# Patient Record
Sex: Female | Born: 1970 | Race: Black or African American | Hispanic: No | Marital: Married | Smoking: Former smoker
Health system: Southern US, Community
[De-identification: ages and names within clinical notes are randomized; demographics above are authoritative.]

## PROBLEM LIST (undated history)

## (undated) DIAGNOSIS — E7800 Pure hypercholesterolemia, unspecified: Secondary | ICD-10-CM

## (undated) DIAGNOSIS — D649 Anemia, unspecified: Secondary | ICD-10-CM

## (undated) DIAGNOSIS — I10 Essential (primary) hypertension: Secondary | ICD-10-CM

## (undated) HISTORY — PX: TUBAL LIGATION: SHX77

---

## 1998-04-27 HISTORY — PX: TONSILLECTOMY: SUR1361

## 2005-07-02 ENCOUNTER — Emergency Department (HOSPITAL_COMMUNITY): Admission: EM | Admit: 2005-07-02 | Discharge: 2005-07-02 | Payer: Self-pay | Admitting: Emergency Medicine

## 2007-09-08 ENCOUNTER — Emergency Department (HOSPITAL_COMMUNITY): Admission: EM | Admit: 2007-09-08 | Discharge: 2007-09-08 | Payer: Self-pay | Admitting: Emergency Medicine

## 2010-06-20 ENCOUNTER — Other Ambulatory Visit: Payer: Self-pay | Admitting: Family Medicine

## 2010-06-20 ENCOUNTER — Other Ambulatory Visit (HOSPITAL_COMMUNITY)
Admission: RE | Admit: 2010-06-20 | Discharge: 2010-06-20 | Disposition: A | Discharge status: Home / Self Care | Payer: 59 | Source: Ambulatory Visit | Attending: Internal Medicine | Admitting: Internal Medicine

## 2010-06-20 DIAGNOSIS — Z01419 Encounter for gynecological examination (general) (routine) without abnormal findings: Secondary | ICD-10-CM | POA: Insufficient documentation

## 2010-06-20 DIAGNOSIS — Z1231 Encounter for screening mammogram for malignant neoplasm of breast: Secondary | ICD-10-CM

## 2010-07-04 ENCOUNTER — Ambulatory Visit
Admission: RE | Admit: 2010-07-04 | Discharge: 2010-07-04 | Disposition: A | Discharge status: Home / Self Care | Payer: 59 | Source: Ambulatory Visit | Attending: Family Medicine | Admitting: Family Medicine

## 2010-07-04 DIAGNOSIS — Z1231 Encounter for screening mammogram for malignant neoplasm of breast: Secondary | ICD-10-CM

## 2010-07-09 ENCOUNTER — Other Ambulatory Visit: Payer: Self-pay | Admitting: Family Medicine

## 2010-07-09 DIAGNOSIS — R928 Other abnormal and inconclusive findings on diagnostic imaging of breast: Secondary | ICD-10-CM

## 2010-08-26 ENCOUNTER — Emergency Department (HOSPITAL_COMMUNITY)
Admission: EM | Admit: 2010-08-26 | Discharge: 2010-08-27 | Disposition: A | Discharge status: Home / Self Care | Payer: 59 | Attending: Emergency Medicine | Admitting: Emergency Medicine

## 2010-08-26 DIAGNOSIS — E119 Type 2 diabetes mellitus without complications: Secondary | ICD-10-CM | POA: Insufficient documentation

## 2010-08-26 DIAGNOSIS — R21 Rash and other nonspecific skin eruption: Secondary | ICD-10-CM | POA: Insufficient documentation

## 2010-08-26 DIAGNOSIS — Z79899 Other long term (current) drug therapy: Secondary | ICD-10-CM | POA: Insufficient documentation

## 2010-08-26 DIAGNOSIS — M79609 Pain in unspecified limb: Secondary | ICD-10-CM | POA: Insufficient documentation

## 2010-08-26 DIAGNOSIS — I10 Essential (primary) hypertension: Secondary | ICD-10-CM | POA: Insufficient documentation

## 2010-08-26 DIAGNOSIS — L02419 Cutaneous abscess of limb, unspecified: Secondary | ICD-10-CM | POA: Insufficient documentation

## 2010-08-26 LAB — DIFFERENTIAL
Eosinophils Relative: 4 % (ref 0–5)
Lymphs Abs: 4.1 10*3/uL — ABNORMAL HIGH (ref 0.7–4.0)
Monocytes Absolute: 0.7 10*3/uL (ref 0.1–1.0)
Neutro Abs: 4.6 10*3/uL (ref 1.7–7.7)

## 2010-08-26 LAB — GLUCOSE, CAPILLARY: Glucose-Capillary: 113 mg/dL — ABNORMAL HIGH (ref 70–99)

## 2010-08-26 LAB — COMPREHENSIVE METABOLIC PANEL
AST: 14 U/L (ref 0–37)
Albumin: 3.9 g/dL (ref 3.5–5.2)
BUN: 15 mg/dL (ref 6–23)
Calcium: 9.9 mg/dL (ref 8.4–10.5)
Creatinine, Ser: 0.95 mg/dL (ref 0.4–1.2)
GFR calc Af Amer: >60 mL/min (ref >60)
Total Protein: 8 g/dL (ref 6.0–8.3)

## 2010-08-26 LAB — CBC
HCT: 36.4 % (ref 36.0–46.0)
Hemoglobin: 12.4 g/dL (ref 12.0–15.0)
MCH: 19.3 pg — ABNORMAL LOW (ref 26.0–34.0)
MCHC: 34.1 g/dL (ref 30.0–36.0)
MCV: 56.7 fL — ABNORMAL LOW (ref 78.0–100.0)
RBC: 6.42 MIL/uL — ABNORMAL HIGH (ref 3.87–5.11)

## 2012-01-21 ENCOUNTER — Encounter (HOSPITAL_COMMUNITY): Payer: Self-pay | Admitting: Emergency Medicine

## 2012-01-21 ENCOUNTER — Inpatient Hospital Stay (HOSPITAL_COMMUNITY)
Admission: EM | Admit: 2012-01-21 | Discharge: 2012-01-23 | DRG: 603 | Disposition: A | Discharge status: Home / Self Care | Payer: 59 | Attending: Internal Medicine | Admitting: Internal Medicine

## 2012-01-21 DIAGNOSIS — D649 Anemia, unspecified: Secondary | ICD-10-CM | POA: Diagnosis present

## 2012-01-21 DIAGNOSIS — D509 Iron deficiency anemia, unspecified: Secondary | ICD-10-CM | POA: Diagnosis present

## 2012-01-21 DIAGNOSIS — R000 Tachycardia, unspecified: Secondary | ICD-10-CM | POA: Diagnosis present

## 2012-01-21 DIAGNOSIS — L03119 Cellulitis of unspecified part of limb: Principal | ICD-10-CM | POA: Diagnosis present

## 2012-01-21 DIAGNOSIS — R599 Enlarged lymph nodes, unspecified: Secondary | ICD-10-CM | POA: Diagnosis present

## 2012-01-21 DIAGNOSIS — L0390 Cellulitis, unspecified: Secondary | ICD-10-CM

## 2012-01-21 DIAGNOSIS — E119 Type 2 diabetes mellitus without complications: Secondary | ICD-10-CM | POA: Diagnosis present

## 2012-01-21 DIAGNOSIS — D72829 Elevated white blood cell count, unspecified: Secondary | ICD-10-CM | POA: Diagnosis present

## 2012-01-21 DIAGNOSIS — L02419 Cutaneous abscess of limb, unspecified: Principal | ICD-10-CM | POA: Diagnosis present

## 2012-01-21 DIAGNOSIS — Z87891 Personal history of nicotine dependence: Secondary | ICD-10-CM

## 2012-01-21 DIAGNOSIS — I10 Essential (primary) hypertension: Secondary | ICD-10-CM | POA: Diagnosis present

## 2012-01-21 HISTORY — DX: Essential (primary) hypertension: I10

## 2012-01-21 LAB — CBC WITH DIFFERENTIAL/PLATELET
Basophils Relative: 0 % (ref 0–1)
Eosinophils Absolute: 0.6 10*3/uL (ref 0.0–0.7)
Eosinophils Relative: 5 % (ref 0–5)
HCT: 29.8 % — ABNORMAL LOW (ref 36.0–46.0)
Hemoglobin: 9.8 g/dL — ABNORMAL LOW (ref 12.0–15.0)
Lymphs Abs: 3.6 10*3/uL (ref 0.7–4.0)
MCH: 18 pg — ABNORMAL LOW (ref 26.0–34.0)
MCHC: 32.9 g/dL (ref 30.0–36.0)
MCV: 54.9 fL — ABNORMAL LOW (ref 78.0–100.0)
Monocytes Absolute: 0.7 10*3/uL (ref 0.1–1.0)
Neutro Abs: 6.6 10*3/uL (ref 1.7–7.7)
Neutrophils Relative %: 58 % (ref 43–77)
RBC: 5.43 MIL/uL — ABNORMAL HIGH (ref 3.87–5.11)

## 2012-01-21 LAB — BASIC METABOLIC PANEL
BUN: 13 mg/dL (ref 6–23)
CO2: 26 mEq/L (ref 19–32)
Calcium: 9.5 mg/dL (ref 8.4–10.5)
Glucose, Bld: 164 mg/dL — ABNORMAL HIGH (ref 70–99)
Sodium: 136 mEq/L (ref 135–145)

## 2012-01-21 LAB — GLUCOSE, CAPILLARY

## 2012-01-21 MED ORDER — LABETALOL HCL 5 MG/ML IV SOLN
10.0000 mg | Freq: Once | INTRAVENOUS | Status: AC
  Administered 2012-01-21: 10 mg via INTRAVENOUS
  Filled 2012-01-21: qty 4

## 2012-01-21 MED ORDER — SODIUM CHLORIDE 0.9 % IV SOLN
Freq: Once | INTRAVENOUS | Status: AC
  Administered 2012-01-21: via INTRAVENOUS

## 2012-01-21 MED ORDER — VANCOMYCIN HCL IN DEXTROSE 1-5 GM/200ML-% IV SOLN
1000.0000 mg | Freq: Once | INTRAVENOUS | Status: AC
  Administered 2012-01-21: 1000 mg via INTRAVENOUS
  Filled 2012-01-21: qty 200

## 2012-01-21 NOTE — ED Notes (Signed)
Reports yesterday had bump on R lateral side of leg; pt reports it popped and clear liquid came out, now today having pain, swelling and redness, and warm to touch; pt noted to have High bp at triage; reports has not taking BP meds in 2 months

## 2012-01-21 NOTE — ED Provider Notes (Signed)
History     CSN: [id number]  Arrival date & time 01/21/12  1804   First MD Initiated Contact with Patient 01/21/12 2211      Chief Complaint  Patient presents with  . Cellulitis    (Consider location/radiation/quality/duration/timing/severity/associated sxs/prior treatment) The history is provided by the patient, a significant other and medical records.    Tracey Cordova is a 41 y.o. female presents to the emergency department complaining of cellulitis.  The onset of the symptoms was  gradual starting 1 days ago.  The patient has associated erythema, swelling, fever, chills.  The symptoms have been  persistent, gradually worsened.  nothing makes the symptoms worse and nothing makes symptoms better.  The patient denies headache, chest pain, shortness of breath, syncope, weakness.  Pt states she has a Hx of cellulitis last year.  She developed this cellulitis beginning yesterday.  She denies trauma to the site.  She states she thought it was an infected hair follicle and "popped it" but then it began to get worse. It is now streaking up her leg and she has pain in her groin.    Past Medical History  Diagnosis Date  . Hypertension   . Diabetes mellitus     Past Surgical History  Procedure Date  . Tubal ligation   . Tonsillectomy 2000    History reviewed. No pertinent family history.  History  Substance Use Topics  . Smoking status: Former Smoker    Quit date: 08/21/2011  . Smokeless tobacco: Not on file  . Alcohol Use: No    OB History    Grav Para Term Preterm Abortions TAB SAB Ect Mult Living                  Review of Systems  Constitutional: Negative for fever, diaphoresis, appetite change, fatigue and unexpected weight change.  HENT: Negative for mouth sores and neck stiffness.   Eyes: Negative for visual disturbance.  Respiratory: Negative for cough, chest tightness, shortness of breath and wheezing.   Cardiovascular: Negative for chest pain.  Gastrointestinal:  Negative for nausea, vomiting, abdominal pain, diarrhea and constipation.  Genitourinary: Negative for dysuria, urgency, frequency and hematuria.  Skin: Positive for wound. Negative for rash.       Abscess  Neurological: Negative for syncope, light-headedness and headaches.  Hematological: Positive for adenopathy.  Psychiatric/Behavioral: Negative for disturbed wake/sleep cycle. The patient is not nervous/anxious.   All other systems reviewed and are negative.    Allergies  Review of patient's allergies indicates no known allergies.  Home Medications   Current Outpatient Rx  Name Route Sig Dispense Refill  . METFORMIN HCL 500 MG PO TABS Oral Take 500 mg by mouth 2 (two) times daily with a meal.      BP 172/80  Pulse 100  Temp 99.5 F (37.5 C) (Oral)  Resp 16  SpO2 100%  Physical Exam  Nursing note and vitals reviewed. Constitutional: She appears well-developed and well-nourished. No distress.  HENT:  Head: Normocephalic and atraumatic.  Mouth/Throat: Oropharynx is clear and moist. No oropharyngeal exudate.  Eyes: Conjunctivae normal are normal. No scleral icterus.  Neck: Normal range of motion. Neck supple.  Cardiovascular: Regular rhythm, normal heart sounds, intact distal pulses and normal pulses.  Tachycardia present.  PMI is not displaced.   Pulmonary/Chest: Effort normal and breath sounds normal. No respiratory distress. She has no wheezes.  Abdominal: Soft. Bowel sounds are normal. She exhibits no mass. There is no tenderness. There is no  rebound and no guarding.  Musculoskeletal: Normal range of motion. She exhibits no edema.  Lymphadenopathy:       Head (right side): No submental, no submandibular, no tonsillar, no preauricular, no posterior auricular and no occipital adenopathy present.       Head (left side): No submental, no submandibular, no tonsillar, no preauricular, no posterior auricular and no occipital adenopathy present.    She has no cervical adenopathy.        Right cervical: No superficial cervical, no deep cervical and no posterior cervical adenopathy present.      Left cervical: No superficial cervical, no deep cervical and no posterior cervical adenopathy present.       Right: No inguinal and no supraclavicular adenopathy present.       Left: Inguinal adenopathy present. No supraclavicular adenopathy present.  Neurological: She is alert.       Speech is clear and goal oriented Moves extremities without ataxia  Skin: Skin is warm and dry. She is not diaphoretic.          Erythema extends from mid calf to above the knee on the RLE Streaking noted to extend into the R groin.    Psychiatric: She has a normal mood and affect.    ED Course  Procedures (including critical care time)  Labs Reviewed  CBC WITH DIFFERENTIAL - Abnormal; Notable for the following:    WBC 11.5 (*)     RBC 5.43 (*)     Hemoglobin 9.8 (*)     HCT 29.8 (*)     MCV 54.9 (*)     MCH 18.0 (*)     RDW 19.0 (*)     Platelets 444 (*)     All other components within normal limits  BASIC METABOLIC PANEL - Abnormal; Notable for the following:    Glucose, Bld 164 (*)     GFR calc non Af Amer 84 (*)     All other components within normal limits  GLUCOSE, CAPILLARY - Abnormal; Notable for the following:    Glucose-Capillary 166 (*)     All other components within normal limits  URINALYSIS, ROUTINE W REFLEX MICROSCOPIC   No results found. Results for orders placed during the hospital encounter of 01/21/12  CBC WITH DIFFERENTIAL      Component Value Range   WBC 11.5 (*) 4.0 - 10.5 K/uL   RBC 5.43 (*) 3.87 - 5.11 MIL/uL   Hemoglobin 9.8 (*) 12.0 - 15.0 g/dL   HCT 16.1 (*) 09.6 - 04.5 %   MCV 54.9 (*) 78.0 - 100.0 fL   MCH 18.0 (*) 26.0 - 34.0 pg   MCHC 32.9  30.0 - 36.0 g/dL   RDW 40.9 (*) 81.1 - 91.4 %   Platelets 444 (*) 150 - 400 K/uL   Neutrophils Relative 58  43 - 77 %   Lymphocytes Relative 31  12 - 46 %   Monocytes Relative 6  3 - 12 %   Eosinophils  Relative 5  0 - 5 %   Basophils Relative 0  0 - 1 %   Neutro Abs 6.6  1.7 - 7.7 K/uL   Lymphs Abs 3.6  0.7 - 4.0 K/uL   Monocytes Absolute 0.7  0.1 - 1.0 K/uL   Eosinophils Absolute 0.6  0.0 - 0.7 K/uL   Basophils Absolute 0.0  0.0 - 0.1 K/uL   RBC Morphology POLYCHROMASIA PRESENT    BASIC METABOLIC PANEL      Component Value  Range   Sodium 136  135 - 145 mEq/L   Potassium 4.2  3.5 - 5.1 mEq/L   Chloride 100  96 - 112 mEq/L   CO2 26  19 - 32 mEq/L   Glucose, Bld 164 (*) 70 - 99 mg/dL   BUN 13  6 - 23 mg/dL   Creatinine, Ser 1.61  0.50 - 1.10 mg/dL   Calcium 9.5  8.4 - 09.6 mg/dL   GFR calc non Af Amer 84 (*) >90 mL/min   GFR calc Af Amer >90  >90 mL/min  GLUCOSE, CAPILLARY      Component Value Range   Glucose-Capillary 166 (*) 70 - 99 mg/dL   Comment 1 Documented in Chart     Comment 2 Notify RN     No results found.    1. Cellulitis   2. Hypertension       MDM  Tracey Cordova presents with cellulitis of the the RLE with streaking into the groin.  Pt tachycardic and hypertensive.  Pt also has diabetes.  Leukocytosis of 11.5 without L shift.  BMP unremarkable.  UA and pregnancy test pending.  Patient hypertension and risk factors will admit to inpatient.  No evidence of  SIRS/sepsis.  His heart oriented, in no apparent distress. Labetalol 10 mg given IV. Patient blood pressure decreased to 172/80. Vancomycin 1 g IV begun. Patient admitted to telemetry bed under Dr Toniann Fail.         Dahlia Client Vihan Santagata, PA-C 01/22/12 0020

## 2012-01-21 NOTE — ED Notes (Signed)
The pt had a pimple on her rt lower lateral ,ower extremity yesterday.  She attempted to drain it with a needle.  Today she has had redness  Swelling and more pain to that area. Weeping from the blistered appearing area.  Redness and swelling to the entire rt leg from the rt groin down to her toes.  Similar  Lesion last year.

## 2012-01-22 ENCOUNTER — Encounter (HOSPITAL_COMMUNITY): Payer: Self-pay | Admitting: Internal Medicine

## 2012-01-22 DIAGNOSIS — D649 Anemia, unspecified: Secondary | ICD-10-CM | POA: Diagnosis present

## 2012-01-22 DIAGNOSIS — E119 Type 2 diabetes mellitus without complications: Secondary | ICD-10-CM

## 2012-01-22 DIAGNOSIS — I10 Essential (primary) hypertension: Secondary | ICD-10-CM

## 2012-01-22 DIAGNOSIS — L0390 Cellulitis, unspecified: Secondary | ICD-10-CM | POA: Diagnosis present

## 2012-01-22 DIAGNOSIS — L0291 Cutaneous abscess, unspecified: Secondary | ICD-10-CM

## 2012-01-22 LAB — URINALYSIS, ROUTINE W REFLEX MICROSCOPIC
Hgb urine dipstick: NEGATIVE
Nitrite: NEGATIVE
Specific Gravity, Urine: 1.035 — ABNORMAL HIGH (ref 1.005–1.030)
Urobilinogen, UA: 0.2 mg/dL (ref 0.0–1.0)
pH: 5 (ref 5.0–8.0)

## 2012-01-22 LAB — COMPREHENSIVE METABOLIC PANEL
AST: 18 U/L (ref 0–37)
Albumin: 3.4 g/dL — ABNORMAL LOW (ref 3.5–5.2)
Alkaline Phosphatase: 72 U/L (ref 39–117)
Chloride: 98 mEq/L (ref 96–112)
Creatinine, Ser: 0.84 mg/dL (ref 0.50–1.10)
Potassium: 3.8 mEq/L (ref 3.5–5.1)
Sodium: 135 mEq/L (ref 135–145)
Total Bilirubin: 0.2 mg/dL — ABNORMAL LOW (ref 0.3–1.2)

## 2012-01-22 LAB — URINE MICROSCOPIC-ADD ON

## 2012-01-22 LAB — CBC
MCH: 17.7 pg — ABNORMAL LOW (ref 26.0–34.0)
MCV: 55 fL — ABNORMAL LOW (ref 78.0–100.0)
Platelets: 403 10*3/uL — ABNORMAL HIGH (ref 150–400)
RBC: 5.2 MIL/uL — ABNORMAL HIGH (ref 3.87–5.11)

## 2012-01-22 LAB — GLUCOSE, CAPILLARY
Glucose-Capillary: 188 mg/dL — ABNORMAL HIGH (ref 70–99)
Glucose-Capillary: 127 mg/dL — ABNORMAL HIGH (ref 70–99)

## 2012-01-22 MED ORDER — LABETALOL HCL 5 MG/ML IV SOLN
10.0000 mg | INTRAVENOUS | Status: DC | PRN
  Administered 2012-01-22: 10 mg via INTRAVENOUS
  Filled 2012-01-22 (×3): qty 4

## 2012-01-22 MED ORDER — LISINOPRIL 20 MG PO TABS
20.0000 mg | ORAL_TABLET | Freq: Every day | ORAL | Status: DC
  Administered 2012-01-22 – 2012-01-23 (×2): 20 mg via ORAL
  Filled 2012-01-22 (×2): qty 1

## 2012-01-22 MED ORDER — HYDROCHLOROTHIAZIDE 25 MG PO TABS
12.5000 mg | ORAL_TABLET | Freq: Every day | ORAL | Status: DC
Start: 2012-01-22 — End: 2012-01-22
  Filled 2012-01-22: qty 0.5

## 2012-01-22 MED ORDER — SODIUM CHLORIDE 0.9 % IV SOLN
INTRAVENOUS | Status: DC
  Administered 2012-01-22 – 2012-01-23 (×2): via INTRAVENOUS

## 2012-01-22 MED ORDER — SODIUM CHLORIDE 0.9 % IV SOLN: INTRAVENOUS | Status: DC

## 2012-01-22 MED ORDER — ACETAMINOPHEN 325 MG PO TABS: 650.0000 mg | ORAL_TABLET | Freq: Four times a day (QID) | ORAL | Status: DC | PRN

## 2012-01-22 MED ORDER — ACETAMINOPHEN 650 MG RE SUPP: 650.0000 mg | Freq: Four times a day (QID) | RECTAL | Status: DC | PRN

## 2012-01-22 MED ORDER — ENOXAPARIN SODIUM 40 MG/0.4ML ~~LOC~~ SOLN
40.0000 mg | SUBCUTANEOUS | Status: DC
  Administered 2012-01-22: 40 mg via SUBCUTANEOUS
  Filled 2012-01-22 (×3): qty 0.4

## 2012-01-22 MED ORDER — ONDANSETRON HCL 4 MG PO TABS: 4.0000 mg | ORAL_TABLET | Freq: Four times a day (QID) | ORAL | Status: DC | PRN

## 2012-01-22 MED ORDER — DIPHENHYDRAMINE HCL 25 MG PO CAPS
25.0000 mg | ORAL_CAPSULE | Freq: Four times a day (QID) | ORAL | Status: DC | PRN
  Administered 2012-01-22: 25 mg via ORAL
  Filled 2012-01-22: qty 1

## 2012-01-22 MED ORDER — CEFEPIME HCL 1 G IJ SOLR
1.0000 g | Freq: Two times a day (BID) | INTRAMUSCULAR | Status: DC
  Administered 2012-01-22 – 2012-01-23 (×4): 1 g via INTRAVENOUS
  Filled 2012-01-22 (×6): qty 1

## 2012-01-22 MED ORDER — HYDROCHLOROTHIAZIDE 12.5 MG PO CAPS
12.5000 mg | ORAL_CAPSULE | Freq: Every day | ORAL | Status: DC
  Administered 2012-01-22 – 2012-01-23 (×2): 12.5 mg via ORAL
  Filled 2012-01-22 (×2): qty 1

## 2012-01-22 MED ORDER — VANCOMYCIN HCL IN DEXTROSE 1-5 GM/200ML-% IV SOLN
1000.0000 mg | Freq: Two times a day (BID) | INTRAVENOUS | Status: DC
  Administered 2012-01-22 – 2012-01-23 (×3): 1000 mg via INTRAVENOUS
  Filled 2012-01-22 (×5): qty 200

## 2012-01-22 MED ORDER — SODIUM CHLORIDE 0.9 % IJ SOLN
3.0000 mL | Freq: Two times a day (BID) | INTRAMUSCULAR | Status: DC
  Administered 2012-01-22: 3 mL via INTRAVENOUS

## 2012-01-22 MED ORDER — ONDANSETRON HCL 4 MG/2ML IJ SOLN: 4.0000 mg | Freq: Four times a day (QID) | INTRAMUSCULAR | Status: DC | PRN

## 2012-01-22 MED ORDER — TETANUS-DIPHTH-ACELL PERTUSSIS 5-2.5-18.5 LF-MCG/0.5 IM SUSP
0.5000 mL | Freq: Once | INTRAMUSCULAR | Status: AC
  Administered 2012-01-22: 0.5 mL via INTRAMUSCULAR
  Filled 2012-01-22 (×2): qty 0.5

## 2012-01-22 MED ORDER — INSULIN ASPART 100 UNIT/ML ~~LOC~~ SOLN
0.0000 [IU] | Freq: Three times a day (TID) | SUBCUTANEOUS | Status: DC
  Administered 2012-01-22: 2 [IU] via SUBCUTANEOUS
  Administered 2012-01-22: 1 [IU] via SUBCUTANEOUS
  Administered 2012-01-22 – 2012-01-23 (×3): 2 [IU] via SUBCUTANEOUS

## 2012-01-22 MED ORDER — DIPHENHYDRAMINE HCL 25 MG PO CAPS
25.0000 mg | ORAL_CAPSULE | Freq: Once | ORAL | Status: AC
  Administered 2012-01-22: 25 mg via ORAL
  Filled 2012-01-22: qty 1

## 2012-01-22 NOTE — Progress Notes (Signed)
Pt seen and examined Admitted w/ 1. RLE cellulitis: continue current ABx, change to PO tomorrow 2. Uncontrolled HTN: start Lisinopril, HCTZ 3. DM: Continue metformin, SSI, check AIC 4. H/o Iron Defi anemia: FU with PCP, anemia panel  Zannie Cove, MD [phone]

## 2012-01-22 NOTE — H&P (Signed)
Tracey Cordova is an 41 y.o. female.   Patient was seen and examined on January 22, 2012. PCP - Dr. Haskel Schroeder. Chief Complaint: Right lower extremity pain and swelling. HPI: 41 year-old female history of diabetes mellitus type 2, hypertension and iron deficiency anemia presents with complaints of pain and swelling on the right leg on the lateral aspect. She has been having these symptoms for last 2 days. Started a small pimple-like lesion which he tried to scratch and has mild discharge coming out of it. Patient also has some groin pain. Denies any fever chills. Had similar symptoms last year on the left thigh. In the ER patient was found to have leukocytosis and was found to be mildly febrile. Patient also mildly tachycardic but blood pressure and control and had to be given labetalol IV to control. Patient has been admitted for further management.  Past Medical History  Diagnosis Date  . Hypertension   . Diabetes mellitus     Past Surgical History  Procedure Date  . Tubal ligation   . Tonsillectomy 2000    Family History  Problem Relation Age of Onset  . Other Mother    Social History:  reports that she quit smoking about 5 months ago. She does not have any smokeless tobacco history on file. She reports that she does not drink alcohol or use illicit drugs.  Allergies: No Known Allergies   (Not in a hospital admission)  Results for orders placed during the hospital encounter of 01/21/12 (from the past 48 hour(s))  CBC WITH DIFFERENTIAL     Status: Abnormal   Collection Time   01/21/12  6:15 PM      Component Value Range Comment   WBC 11.5 (*) 4.0 - 10.5 K/uL    RBC 5.43 (*) 3.87 - 5.11 MIL/uL    Hemoglobin 9.8 (*) 12.0 - 15.0 g/dL    HCT 16.1 (*) 09.6 - 46.0 %    MCV 54.9 (*) 78.0 - 100.0 fL    MCH 18.0 (*) 26.0 - 34.0 pg    MCHC 32.9  30.0 - 36.0 g/dL    RDW 04.5 (*) 40.9 - 15.5 %    Platelets 444 (*) 150 - 400 K/uL    Neutrophils Relative 58  43 - 77 %    Lymphocytes  Relative 31  12 - 46 %    Monocytes Relative 6  3 - 12 %    Eosinophils Relative 5  0 - 5 %    Basophils Relative 0  0 - 1 %    Neutro Abs 6.6  1.7 - 7.7 K/uL    Lymphs Abs 3.6  0.7 - 4.0 K/uL    Monocytes Absolute 0.7  0.1 - 1.0 K/uL    Eosinophils Absolute 0.6  0.0 - 0.7 K/uL    Basophils Absolute 0.0  0.0 - 0.1 K/uL    RBC Morphology POLYCHROMASIA PRESENT   TARGET CELLS  BASIC METABOLIC PANEL     Status: Abnormal   Collection Time   01/21/12  6:15 PM      Component Value Range Comment   Sodium 136  135 - 145 mEq/L    Potassium 4.2  3.5 - 5.1 mEq/L    Chloride 100  96 - 112 mEq/L    CO2 26  19 - 32 mEq/L    Glucose, Bld 164 (*) 70 - 99 mg/dL    BUN 13  6 - 23 mg/dL    Creatinine, Ser 8.11  0.50 - 1.10 mg/dL    Calcium 9.5  8.4 - 16.1 mg/dL    GFR calc non Af Amer 84 (*) >90 mL/min    GFR calc Af Amer >90  >90 mL/min   GLUCOSE, CAPILLARY     Status: Abnormal   Collection Time   01/21/12  6:18 PM      Component Value Range Comment   Glucose-Capillary 166 (*) 70 - 99 mg/dL    Comment 1 Documented in Chart      Comment 2 Notify RN      No results found.  Review of Systems  Constitutional: Negative.   HENT: Negative.   Eyes: Negative.   Respiratory: Negative.   Cardiovascular: Negative.   Gastrointestinal: Negative.   Genitourinary: Negative.   Musculoskeletal:       Pain and swelling of the right leg.  Skin: Negative.   Neurological: Negative.   Endo/Heme/Allergies: Negative.   Psychiatric/Behavioral: Negative.     Blood pressure 172/80, pulse 100, temperature 99.5 F (37.5 C), temperature source Oral, resp. rate 16, SpO2 100.00%. Physical Exam  Constitutional: She is oriented to person, place, and time. She appears well-developed and well-nourished. No distress.  HENT:  Head: Normocephalic and atraumatic.  Right Ear: External ear normal.  Left Ear: External ear normal.  Nose: Nose normal.  Mouth/Throat: Oropharynx is clear and moist. No oropharyngeal exudate.    Eyes: Conjunctivae normal are normal. Pupils are equal, round, and reactive to light. Right eye exhibits no discharge. Left eye exhibits no discharge. No scleral icterus.  Neck: Normal range of motion. Neck supple.  Cardiovascular: Normal rate and regular rhythm.   Respiratory: Effort normal and breath sounds normal. No respiratory distress. She has no wheezes. She has no rales.  GI: Soft. Bowel sounds are normal. She exhibits no distension. There is no tenderness. There is no rebound and no guarding.  Musculoskeletal:       Mildly indurated skin on the lateral aspect of right lef with small opening and discharge.  Neurological: She is alert and oriented to person, place, and time.       Moves all extremities.  Skin: She is not diaphoretic.     Assessment/Plan #1. Cellulitis of the right leg - at this time patient has been started vancomycin. At cefepime for now. Closely observe. Any worsening may need MRI to check for any abscess formation. #2. Uncontrolled hypertension - patient states she takes antihypertensives the name of which he does not recall. Her blood pressure was more than 200 initially and had to be given labetalol IV bolus. I'm going to continue with labetalol 10 mg IV bolus every 2 hours for systolic more than 160. Once patient's home medication's name is known we will continue to closely observe her blood pressure. #3. Diabetes mellitus2 - while in the hospital we will place patient on sliding-scale coverage. Check hemoglobin A1c. #4. Iron deficiency anemia - follow CBC.  Patient's pregnancy screen is pending.  CODE STATUS - full code.  Keala Drum N. 01/22/2012, 12:54 AM

## 2012-01-22 NOTE — ED Notes (Signed)
Report to 6700

## 2012-01-22 NOTE — ED Provider Notes (Signed)
Medical screening examination/treatment/procedure(s) were performed by non-physician practitioner and as supervising physician I was immediately available for consultation/collaboration.  Loren Racer, MD 01/22/12 [phone]

## 2012-01-22 NOTE — Progress Notes (Signed)
Noted request to assist with medications, however pt has Nurse, learning disability, will provide applications from [employer] if aware of medications prior to d/c. Pt with insurance often do not meet criteria will follow. Johny Shock RN MPH Case Manager [phone]

## 2012-01-22 NOTE — Progress Notes (Signed)
Utilization Review Completed.  

## 2012-01-22 NOTE — Progress Notes (Signed)
ANTIBIOTIC CONSULT NOTE - INITIAL  Pharmacy Consult for Vancomycin/Cefepime Indication: RLE cellulitis  No Known Allergies  Vital Signs: Temp: 99.5 F (37.5 C) (09/26 2235) Temp src: Oral (09/26 2235) BP: 172/80 mmHg (09/26 2358) Pulse Rate: 100  (09/26 2235) Intake/Output from previous day:   Intake/Output from this shift:    Labs:  Basename 01/21/12 1815  WBC 11.5*  HGB 9.8*  PLT 444*  LABCREA --  CREATININE 0.84   CrCl is unknown because there is no height on file for the current visit. No results found for this basename: VANCOTROUGH:2,VANCOPEAK:2,VANCORANDOM:2,GENTTROUGH:2,GENTPEAK:2,GENTRANDOM:2,TOBRATROUGH:2,TOBRAPEAK:2,TOBRARND:2,AMIKACINPEAK:2,AMIKACINTROU:2,AMIKACIN:2, in the last 72 hours   Microbiology: No results found for this or any previous visit (from the past 720 hour(s)).  Medical History: Past Medical History  Diagnosis Date  . Hypertension   . Diabetes mellitus     Medications:   (Not in a hospital admission) Assessment: 41 y/o female patient admitted with right lower extremity pain and swelling requiring broad spectrum antibiotics for cellulitis. Received 1g Vancomycin at 2345 in ED.  Goal of Therapy:  Vancomycin trough level 10-15 mcg/ml  Plan:  Vancomycin 1g IV q12h, cefepime 1g IV q12 and will monitor renal function. Measure antibiotic drug levels at steady state  Verlene Mayer, PharmD, BCPS Pager [phone] 01/22/2012,1:50 AM

## 2012-01-23 LAB — GLUCOSE, CAPILLARY
Glucose-Capillary: 155 mg/dL — ABNORMAL HIGH (ref 70–99)
Glucose-Capillary: 177 mg/dL — ABNORMAL HIGH (ref 70–99)

## 2012-01-23 MED ORDER — DOXYCYCLINE HYCLATE 100 MG PO TABS: 100.0000 mg | ORAL_TABLET | Freq: Two times a day (BID) | ORAL | Status: DC

## 2012-01-23 MED ORDER — HYDROCHLOROTHIAZIDE 12.5 MG PO CAPS: 12.5000 mg | ORAL_CAPSULE | Freq: Every day | ORAL | Status: DC

## 2012-01-23 MED ORDER — LISINOPRIL 20 MG PO TABS: 20.0000 mg | ORAL_TABLET | Freq: Every day | ORAL | Status: DC

## 2012-01-23 NOTE — Progress Notes (Signed)
Pt discharged to home accompanied by family. Discharge instructions given and explained. Pt stated understanding. Pt informed that rx were called into [facility] pharmacy on [address]. Pt left unit in stable condition.

## 2012-01-25 NOTE — Progress Notes (Signed)
   CARE MANAGEMENT NOTE 01/25/2012  Patient:  Tracey Cordova, Tracey Cordova   Account Number:  [id number]  Date Initiated:  01/25/2012  Documentation initiated by:  Catarina Huntley  Subjective/Objective Assessment:   Request for assistance with medication.     Action/Plan:   Pt has commercial insurance with medication benefits, CM unable to assist.   Anticipated DC Date:  01/23/2012   Anticipated DC Plan:  HOME/SELF CARE         Choice offered to / List presented to:             Status of service:  Completed, signed off Medicare Important Message given?   (If response is "NO", the following Medicare IM given date fields will be blank) Date Medicare IM given:   Date Additional Medicare IM given:    Discharge Disposition:  HOME/SELF CARE  Per UR Regulation:    If discussed at Long Length of Stay Meetings, dates discussed:    Comments:

## 2012-02-04 NOTE — Discharge Summary (Signed)
Physician Discharge Summary  Patient ID: Tracey Cordova MRN: [id number] DOB/AGE: [birth year] 40 y.o.  Admit date: 01/21/2012 Discharge date: 02/04/2012  Primary Care Physician:  No primary provider on file.   Discharge Diagnoses:    Principal Problem:  *Cellulitis Active Problems:  DM2 (diabetes mellitus, type 2)  HTN (hypertension)  Obesity  Uncontrolled HTN  Iron deficiency anemia     Medication List     As of 02/04/2012  2:57 PM    TAKE these medications         doxycycline 100 MG tablet   Commonly known as: VIBRA-TABS   Take 1 tablet (100 mg total) by mouth 2 (two) times daily. For 8 days      hydrochlorothiazide 12.5 MG capsule   Commonly known as: MICROZIDE   Take 1 capsule (12.5 mg total) by mouth daily.      lisinopril 20 MG tablet   Commonly known as: PRINIVIL,ZESTRIL   Take 1 tablet (20 mg total) by mouth daily.      metFORMIN 500 MG tablet   Commonly known as: GLUCOPHAGE   Take 500 mg by mouth 2 (two) times daily with a meal.         Disposition and Follow-up:  PCP in 1-2 weeks    Significant Diagnostic Studies:  No results found.  Brief H and P:  41 year-old female history of diabetes mellitus type 2, hypertension and iron deficiency anemia presents with complaints of pain and swelling on the right leg on the lateral aspect. She has been having these symptoms for last 2 days. Started a small pimple-like lesion which he tried to scratch and has mild discharge coming out of it. Patient also has some groin pain. Denies any fever chills. Had similar symptoms last year on the left thigh. In the ER patient was found to have leukocytosis and was found to be mildly febrile. Patient also mildly tachycardic but blood pressure and control and had to be given labetalol IV to control. Patient has been admitted for further management.    Hospital Course:  1. RLE cellulitis:  Improved with empiric Vanc/Zosyn Changed to Po Doxycycline at discharge   2.  Uncontrolled HTN: started Lisinopril, HCTZ  Was non complaint with medications prior to admission  3. DM: Continue metformin, SSI, AIC 7.7, lifestyle modification emphasized   4. H/o Iron Defi anemia: FU with PCP, anemia panel pending, suspect from menstrual blood loss    Time spent on Discharge:  Signed: Kennth Vanbenschoten [facility]  02/04/2012, 2:57 PM

## 2012-05-29 ENCOUNTER — Encounter (HOSPITAL_COMMUNITY): Payer: Self-pay | Admitting: Emergency Medicine

## 2012-05-29 ENCOUNTER — Emergency Department (HOSPITAL_COMMUNITY)
Admission: EM | Admit: 2012-05-29 | Discharge: 2012-05-29 | Disposition: A | Discharge status: Home / Self Care | Payer: 59 | Attending: Emergency Medicine | Admitting: Emergency Medicine

## 2012-05-29 DIAGNOSIS — Z79899 Other long term (current) drug therapy: Secondary | ICD-10-CM | POA: Insufficient documentation

## 2012-05-29 DIAGNOSIS — I10 Essential (primary) hypertension: Secondary | ICD-10-CM | POA: Insufficient documentation

## 2012-05-29 DIAGNOSIS — E119 Type 2 diabetes mellitus without complications: Secondary | ICD-10-CM | POA: Insufficient documentation

## 2012-05-29 DIAGNOSIS — IMO0002 Reserved for concepts with insufficient information to code with codable children: Secondary | ICD-10-CM | POA: Insufficient documentation

## 2012-05-29 DIAGNOSIS — M7989 Other specified soft tissue disorders: Secondary | ICD-10-CM | POA: Insufficient documentation

## 2012-05-29 DIAGNOSIS — Z87891 Personal history of nicotine dependence: Secondary | ICD-10-CM | POA: Insufficient documentation

## 2012-05-29 DIAGNOSIS — L0390 Cellulitis, unspecified: Secondary | ICD-10-CM

## 2012-05-29 MED ORDER — CEPHALEXIN 500 MG PO CAPS: ORAL_CAPSULE | ORAL | Status: DC

## 2012-05-29 MED ORDER — DIPHENHYDRAMINE HCL 25 MG PO TABS: 50.0000 mg | ORAL_TABLET | ORAL | Status: DC | PRN

## 2012-05-29 MED ORDER — OXYCODONE-ACETAMINOPHEN 5-325 MG PO TABS: 2.0000 | ORAL_TABLET | ORAL | Status: DC | PRN

## 2012-05-29 NOTE — ED Provider Notes (Signed)
History     CSN: [id number]  Arrival date & time 05/29/12  2027   First MD Initiated Contact with Patient 05/29/12 2108      Chief Complaint  Patient presents with  . Arm Pain  . Arm Swelling    (Consider location/radiation/quality/duration/timing/severity/associated sxs/prior treatment) HPI This 42 year old female has a history of cellulitis in the past, she woke up today and has a one-day history of some redness warmth and tenderness to her left forearm with some itching as well. There is no weakness or numbness to the arm. She is no fever vomiting lightheadedness shortness of breath abdominal pain or other concerns. There is no treatment prior to arrival. Her pain is moderately severe. Her pain is worse with palpation and better she keeps her arm still. There is no asymmetry no red streaks up her arm. There is no trauma. She feels just like when she had prior localized cellulitis. She does not have any polyuria or polydipsia nausea or vomiting. Past Medical History  Diagnosis Date  . Hypertension   . Diabetes mellitus     Past Surgical History  Procedure Laterality Date  . Tubal ligation    . Tonsillectomy  2000    Family History  Problem Relation Age of Onset  . Other Mother     History  Substance Use Topics  . Smoking status: Former Smoker    Quit date: 08/21/2011  . Smokeless tobacco: Not on file  . Alcohol Use: Yes     Comment: Occassional Use    OB History   Grav Para Term Preterm Abortions TAB SAB Ect Mult Living                  Review of Systems 10 Systems reviewed and are negative for acute change except as noted in the HPI. Allergies  Lisinopril  Home Medications   Current Outpatient Rx  Name  Route  Sig  Dispense  Refill  . cloNIDine (CATAPRES) 0.3 MG tablet   Oral   Take 0.3 mg by mouth 2 (two) times daily.         . ferrous sulfate 325 (65 FE) MG tablet   Oral   Take 325 mg by mouth daily with breakfast.         .  hydrochlorothiazide (MICROZIDE) 12.5 MG capsule   Oral   Take 1 capsule (12.5 mg total) by mouth daily.   30 capsule   0   . metFORMIN (GLUCOPHAGE) 500 MG tablet   Oral   Take 500 mg by mouth 2 (two) times daily with a meal.         . olmesartan (BENICAR) 40 MG tablet   Oral   Take 40 mg by mouth daily.         . cephALEXin (KEFLEX) 500 MG capsule      2 caps po bid x 7 days   28 capsule   0   . diphenhydrAMINE (BENADRYL) 25 MG tablet   Oral   Take 2 tablets (50 mg total) by mouth every 4 (four) hours as needed for itching.   20 tablet   0   . oxyCODONE-acetaminophen (PERCOCET) 5-325 MG per tablet   Oral   Take 2 tablets by mouth every 4 (four) hours as needed for pain.   20 tablet   0     BP 231/119  Temp(Src) 98.6 F (37 C) (Oral)  Resp 18  SpO2 95%  LMP 05/28/2012  Physical Exam  Nursing  note and vitals reviewed. Constitutional:       Awake, alert, nontoxic appearance.  HENT:  Head: Atraumatic.  Eyes: Right eye exhibits no discharge. Left eye exhibits no discharge.  Neck: Neck supple.  Cardiovascular: Normal rate and regular rhythm.   No murmur heard. Pulmonary/Chest: Effort normal and breath sounds normal. No respiratory distress. She has no wheezes. She has no rales. She exhibits no tenderness.  Abdominal: Soft. Bowel sounds are normal. She exhibits no distension and no mass. There is no tenderness. There is no rebound and no guarding.  Musculoskeletal: She exhibits tenderness.       Baseline ROM, no obvious new focal weakness. Right arm and both legs are nontender. Left arm has what appears to be localized cellulitis to the proximal medial forearm and medial distal upper arm with erythema edema induration warmth tenderness without obvious abscess or any fluctuance, it almost appears as if it is an urticarial plaque but no petechiae purpura fluctuance vesicles and there is no diffuse arm swelling or edema to the arm at all she also has cap refill less  than 2 seconds in her hand with normal light touch to her arm with good movement and her joints are nontender and there is no circumferential redness or tenderness or swelling. Clinically I doubt deep venous thrombosis necrotizing fasciitis abscess sepsis or other condition requiring further evaluation in the ED tonight. She appears stable for outpatient followup.  Neurological: She is alert.       Mental status and motor strength appears baseline for patient and situation.  Skin: No rash noted.  Psychiatric: She has a normal mood and affect.    ED Course  Procedures (including critical care time)  Labs Reviewed - No data to display No results found.   1. Cellulitis       MDM  Patient / Family / Caregiver informed of clinical course, understand medical decision-making process, and agree with plan.  I doubt any other EMC precluding discharge at this time including, but not necessarily limited to the following: DVT. Necrotizing fasciitis, sepsis.        Hurman Horn, MD 06/04/12 [phone]

## 2012-05-29 NOTE — ED Notes (Addendum)
Patient complaining of left arm swelling and redness that started today (mostly around elbow); patient reports that she has a history of cellulitis in the past and has been treated here twice before.  Patient reports that she has had IVs in the past to receive IV antibiotics for cellulitis.  Last cellulitis episode in September of last year.

## 2012-07-22 ENCOUNTER — Encounter (HOSPITAL_COMMUNITY): Payer: Self-pay | Admitting: Emergency Medicine

## 2012-07-22 ENCOUNTER — Emergency Department (HOSPITAL_COMMUNITY)
Admission: EM | Admit: 2012-07-22 | Discharge: 2012-07-23 | Disposition: A | Discharge status: Home / Self Care | Payer: 59 | Attending: Emergency Medicine | Admitting: Emergency Medicine

## 2012-07-22 DIAGNOSIS — E7800 Pure hypercholesterolemia, unspecified: Secondary | ICD-10-CM | POA: Insufficient documentation

## 2012-07-22 DIAGNOSIS — Z87891 Personal history of nicotine dependence: Secondary | ICD-10-CM | POA: Insufficient documentation

## 2012-07-22 DIAGNOSIS — Z8679 Personal history of other diseases of the circulatory system: Secondary | ICD-10-CM | POA: Insufficient documentation

## 2012-07-22 DIAGNOSIS — Y92410 Unspecified street and highway as the place of occurrence of the external cause: Secondary | ICD-10-CM | POA: Insufficient documentation

## 2012-07-22 DIAGNOSIS — T148XXA Other injury of unspecified body region, initial encounter: Secondary | ICD-10-CM | POA: Insufficient documentation

## 2012-07-22 DIAGNOSIS — S0993XA Unspecified injury of face, initial encounter: Secondary | ICD-10-CM | POA: Insufficient documentation

## 2012-07-22 DIAGNOSIS — D649 Anemia, unspecified: Secondary | ICD-10-CM | POA: Insufficient documentation

## 2012-07-22 DIAGNOSIS — Y9389 Activity, other specified: Secondary | ICD-10-CM | POA: Insufficient documentation

## 2012-07-22 DIAGNOSIS — IMO0002 Reserved for concepts with insufficient information to code with codable children: Secondary | ICD-10-CM | POA: Insufficient documentation

## 2012-07-22 DIAGNOSIS — I10 Essential (primary) hypertension: Secondary | ICD-10-CM | POA: Insufficient documentation

## 2012-07-22 DIAGNOSIS — S199XXA Unspecified injury of neck, initial encounter: Secondary | ICD-10-CM | POA: Insufficient documentation

## 2012-07-22 DIAGNOSIS — E119 Type 2 diabetes mellitus without complications: Secondary | ICD-10-CM | POA: Insufficient documentation

## 2012-07-22 DIAGNOSIS — S7010XA Contusion of unspecified thigh, initial encounter: Secondary | ICD-10-CM | POA: Insufficient documentation

## 2012-07-22 DIAGNOSIS — Z79899 Other long term (current) drug therapy: Secondary | ICD-10-CM | POA: Insufficient documentation

## 2012-07-22 HISTORY — DX: Pure hypercholesterolemia, unspecified: E78.00

## 2012-07-22 HISTORY — DX: Anemia, unspecified: D64.9
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
## 2012-07-23 MED ORDER — DIAZEPAM 5 MG PO TABS: 5.0000 mg | ORAL_TABLET | Freq: Two times a day (BID) | ORAL | Status: DC
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
## 2012-08-10 NOTE — ED Provider Notes (Signed)
Medical screening examination/treatment/procedure(s) were performed by non-physician practitioner and as supervising physician I was immediately available for consultation/collaboration.   Seema Blum, MD 08/10/12 0811 

## 2013-06-14 ENCOUNTER — Encounter (HOSPITAL_COMMUNITY): Payer: Self-pay | Admitting: Emergency Medicine

## 2013-06-14 ENCOUNTER — Emergency Department (HOSPITAL_COMMUNITY)
Admission: EM | Admit: 2013-06-14 | Discharge: 2013-06-15 | Disposition: A | Discharge status: Home / Self Care | Payer: 59 | Attending: Emergency Medicine | Admitting: Emergency Medicine

## 2013-06-14 DIAGNOSIS — I169 Hypertensive crisis, unspecified: Secondary | ICD-10-CM

## 2013-06-14 DIAGNOSIS — E7800 Pure hypercholesterolemia, unspecified: Secondary | ICD-10-CM | POA: Insufficient documentation

## 2013-06-14 DIAGNOSIS — E119 Type 2 diabetes mellitus without complications: Secondary | ICD-10-CM | POA: Insufficient documentation

## 2013-06-14 DIAGNOSIS — L0390 Cellulitis, unspecified: Secondary | ICD-10-CM

## 2013-06-14 DIAGNOSIS — D649 Anemia, unspecified: Secondary | ICD-10-CM | POA: Insufficient documentation

## 2013-06-14 DIAGNOSIS — Z79899 Other long term (current) drug therapy: Secondary | ICD-10-CM | POA: Insufficient documentation

## 2013-06-14 DIAGNOSIS — L02419 Cutaneous abscess of limb, unspecified: Secondary | ICD-10-CM | POA: Insufficient documentation

## 2013-06-14 DIAGNOSIS — L03119 Cellulitis of unspecified part of limb: Principal | ICD-10-CM

## 2013-06-14 DIAGNOSIS — I10 Essential (primary) hypertension: Secondary | ICD-10-CM | POA: Insufficient documentation

## 2013-06-14 DIAGNOSIS — Z87891 Personal history of nicotine dependence: Secondary | ICD-10-CM | POA: Insufficient documentation

## 2013-06-14 NOTE — ED Notes (Addendum)
Pt denies headaches or blurred vision; reports BP always this high even though she takes BP meds.

## 2013-06-14 NOTE — ED Notes (Signed)
Pt states right side lower extremity swelling. Right side calf is red and swollen from ankle up to calf. Pt states hx of cellulitis.

## 2013-06-15 LAB — CBC WITH DIFFERENTIAL/PLATELET
BASOS ABS: 0 10*3/uL (ref 0.0–0.1)
Basophils Relative: 0 % (ref 0–1)
EOS PCT: 5 % (ref 0–5)
Eosinophils Absolute: 0.5 10*3/uL (ref 0.0–0.7)
HEMATOCRIT: 33.9 % — AB (ref 36.0–46.0)
HEMOGLOBIN: 11.8 g/dL — AB (ref 12.0–15.0)
LYMPHS ABS: 3.5 10*3/uL (ref 0.7–4.0)
LYMPHS PCT: 36 % (ref 12–46)
MCH: 22.4 pg — ABNORMAL LOW (ref 26.0–34.0)
MCHC: 34.8 g/dL (ref 30.0–36.0)
MCV: 64.4 fL — AB (ref 78.0–100.0)
MONOS PCT: 5 % (ref 3–12)
Monocytes Absolute: 0.5 10*3/uL (ref 0.1–1.0)
NEUTROS ABS: 5.3 10*3/uL (ref 1.7–7.7)
Neutrophils Relative %: 54 % (ref 43–77)
Platelets: 341 10*3/uL (ref 150–400)
RBC: 5.26 MIL/uL — AB (ref 3.87–5.11)
RDW: 17.4 % — AB (ref 11.5–15.5)
WBC: 9.8 10*3/uL (ref 4.0–10.5)

## 2013-06-15 LAB — BASIC METABOLIC PANEL
BUN: 16 mg/dL (ref 6–23)
CHLORIDE: 95 meq/L — AB (ref 96–112)
CO2: 22 meq/L (ref 19–32)
Calcium: 9.9 mg/dL (ref 8.4–10.5)
Creatinine, Ser: 0.96 mg/dL (ref 0.50–1.10)
GFR calc non Af Amer: 71 mL/min — ABNORMAL LOW (ref >90)
GFR, EST AFRICAN AMERICAN: 82 mL/min — AB (ref >90)
Glucose, Bld: 257 mg/dL — ABNORMAL HIGH (ref 70–99)
POTASSIUM: 4.3 meq/L (ref 3.7–5.3)
Sodium: 134 mEq/L — ABNORMAL LOW (ref 137–147)

## 2013-06-15 LAB — GLUCOSE, CAPILLARY: GLUCOSE-CAPILLARY: 234 mg/dL — AB (ref 70–99)

## 2013-06-15 MED ORDER — CLINDAMYCIN PHOSPHATE 600 MG/50ML IV SOLN
600.0000 mg | Freq: Once | INTRAVENOUS | Status: AC
  Administered 2013-06-15: 600 mg via INTRAVENOUS
  Filled 2013-06-15: qty 50

## 2013-06-15 MED ORDER — HYDROCHLOROTHIAZIDE 12.5 MG PO CAPS: 12.5000 mg | ORAL_CAPSULE | Freq: Every day | ORAL | Status: DC

## 2013-06-15 MED ORDER — CLONIDINE HCL 0.2 MG PO TABS
0.3000 mg | ORAL_TABLET | ORAL | Status: AC
  Administered 2013-06-15: 0.3 mg via ORAL
  Filled 2013-06-15 (×2): qty 1

## 2013-06-15 MED ORDER — HYDROCODONE-ACETAMINOPHEN 5-325 MG PO TABS: 1.0000 | ORAL_TABLET | ORAL | Status: DC | PRN

## 2013-06-15 MED ORDER — METFORMIN HCL 500 MG PO TABS
1000.0000 mg | ORAL_TABLET | Freq: Once | ORAL | Status: AC
  Administered 2013-06-15: 1000 mg via ORAL
  Filled 2013-06-15: qty 2

## 2013-06-15 MED ORDER — OXYCODONE-ACETAMINOPHEN 5-325 MG PO TABS
2.0000 | ORAL_TABLET | Freq: Once | ORAL | Status: AC
  Administered 2013-06-15: 2 via ORAL
  Filled 2013-06-15: qty 2

## 2013-06-15 MED ORDER — CLINDAMYCIN HCL 300 MG PO CAPS: 300.0000 mg | ORAL_CAPSULE | Freq: Three times a day (TID) | ORAL | Status: DC

## 2013-06-15 NOTE — Discharge Instructions (Signed)
Cellulitis Cellulitis is an infection of the skin and the tissue beneath it. The infected area is usually red and tender. Cellulitis occurs most often in the arms and lower legs.  CAUSES  Cellulitis is caused by bacteria that enter the skin through cracks or cuts in the skin. The most common types of bacteria that cause cellulitis are Staphylococcus and Streptococcus. SYMPTOMS   Redness and warmth.  Swelling.  Tenderness or pain.  Fever. DIAGNOSIS  Your caregiver can usually determine what is wrong based on a physical exam. Blood tests may also be done. TREATMENT  Treatment usually involves taking an antibiotic medicine. HOME CARE INSTRUCTIONS   Take your antibiotics as directed. Finish them even if you start to feel better.  Keep the infected arm or leg elevated to reduce swelling.  Apply a warm cloth to the affected area up to 4 times per day to relieve pain.  Only take over-the-counter or prescription medicines for pain, discomfort, or fever as directed by your caregiver.  Keep all follow-up appointments as directed by your caregiver. SEEK MEDICAL CARE IF:   You notice red streaks coming from the infected area.  Your red area gets larger or turns dark in color.  Your bone or joint underneath the infected area becomes painful after the skin has healed.  Your infection returns in the same area or another area.  You notice a swollen bump in the infected area.  You develop new symptoms. SEEK IMMEDIATE MEDICAL CARE IF:   You have a fever.  You feel very sleepy.  You develop vomiting or diarrhea.  You have a general ill feeling (malaise) with muscle aches and pains. MAKE SURE YOU:   Understand these instructions.  Will watch your condition.  Will get help right away if you are not doing well or get worse. Document Released: 01/21/2005 Document Revised: 10/13/2011 Document Reviewed: 06/29/2011 [employer] Patient Information 2014 [employer].  

## 2013-06-15 NOTE — ED Provider Notes (Signed)
CSN: [id number]     Arrival date & time 06/14/13  1915 History   First MD Initiated Contact with Patient 06/14/13 2345     Chief Complaint  Patient presents with  . Leg Swelling     (Consider location/radiation/quality/duration/timing/severity/associated sxs/prior Treatment) HPI  Patient is a 43 yo type II diabetic who presents with 24 hrs of increasingly severe pain in the right lower leg.  She has associated increased warmth and erythema over the medial aspect of the extremity. Pain is 8/10, worse with prolonged standing, throbbing, aching. Pain radiates proximally to the medial right thigh. Pt denies fever. She says she has remote history of cellulitis x 2. Patient is noted to be markedly hypertensive at 241/121. She has not had evening Clonidine 0.3mg  dose nor HCTZ 12.5mg . Patient denies chest pain and headache and sob.  Past Medical History  Diagnosis Date  . Hypertension   . Diabetes mellitus   . High cholesterol   . Anemia    Past Surgical History  Procedure Laterality Date  . Tubal ligation    . Tonsillectomy  2000   Family History  Problem Relation Age of Onset  . Other Mother    History  Substance Use Topics  . Smoking status: Former Smoker    Quit date: 08/21/2011  . Smokeless tobacco: Not on file  . Alcohol Use: Yes     Comment: Occassional Use   OB History   Grav Para Term Preterm Abortions TAB SAB Ect Mult Living                 Review of Systems Ten point review of symptoms performed and is negative with the exception of symptoms noted above.     Allergies  Lisinopril  Home Medications   Current Outpatient Rx  Name  Route  Sig  Dispense  Refill  . cloNIDine (CATAPRES) 0.3 MG tablet   Oral   Take 0.3 mg by mouth 2 (two) times daily.         . ferrous sulfate 325 (65 FE) MG tablet   Oral   Take 325 mg by mouth daily with breakfast.         . hydrochlorothiazide (MICROZIDE) 12.5 MG capsule   Oral   Take 1 capsule (12.5 mg total) by  mouth daily.   30 capsule   0   . metFORMIN (GLUCOPHAGE) 500 MG tablet   Oral   Take 500 mg by mouth 2 (two) times daily with a meal.         . naproxen sodium (ANAPROX) 220 MG tablet   Oral   Take 220 mg by mouth 2 (two) times daily as needed (for cramps).         . olmesartan (BENICAR) 40 MG tablet   Oral   Take 40 mg by mouth daily.          BP 221/94  Pulse 96  Temp(Src) 98.4 F (36.9 C) (Oral)  Resp 16  Wt 226 lb 1.6 oz (102.558 kg)  SpO2 100%  LMP 05/21/2013 Physical Exam Gen: well developed and well nourished appearing Head: NCAT Eyes: PERL, EOMI Nose: no epistaixis or rhinorrhea Mouth/throat: mucosa is moist and pink Neck: supple, no stridor Lungs: CTA B, no wheezing, rhonchi or rales CV: RRR, no murmur, extremities appear well perfused.  Abd: soft, notender, nondistended Back: no ttp, no cva ttp Skin: warm and dry Ext: RLE: approx 14cm x 10cm region of erythema over the medial right lower leg with  increased wamth, mild induration and proximal streaking to the mid thigh, DP pulses palp and symmetric, No ankle or knee joint involvement, sensation intact to light touch throguhout, cap refill < 2s.  Neuro: CN ii-xii grossly intact, no focal deficits Psyche; normal affect,  calm and cooperative.   ED Course  Procedures (including critical care time) Labs Review  Results for orders placed during the hospital encounter of 06/14/13 (from the past 24 hour(s))  CBC WITH DIFFERENTIAL     Status: Abnormal   Collection Time    06/15/13 12:33 AM      Result Value Ref Range   WBC 9.8  4.0 - 10.5 K/uL   RBC 5.26 (*) 3.87 - 5.11 MIL/uL   Hemoglobin 11.8 (*) 12.0 - 15.0 g/dL   HCT 16.1 (*) 09.6 - 04.5 %   MCV 64.4 (*) 78.0 - 100.0 fL   MCH 22.4 (*) 26.0 - 34.0 pg   MCHC 34.8  30.0 - 36.0 g/dL   RDW 40.9 (*) 81.1 - 91.4 %   Platelets 341  150 - 400 K/uL   Neutrophils Relative % 54  43 - 77 %   Lymphocytes Relative 36  12 - 46 %   Monocytes Relative 5  3 - 12 %    Eosinophils Relative 5  0 - 5 %   Basophils Relative 0  0 - 1 %   Neutro Abs 5.3  1.7 - 7.7 K/uL   Lymphs Abs 3.5  0.7 - 4.0 K/uL   Monocytes Absolute 0.5  0.1 - 1.0 K/uL   Eosinophils Absolute 0.5  0.0 - 0.7 K/uL   Basophils Absolute 0.0  0.0 - 0.1 K/uL   RBC Morphology TARGET CELLS    BASIC METABOLIC PANEL     Status: Abnormal   Collection Time    06/15/13 12:33 AM      Result Value Ref Range   Sodium 134 (*) 137 - 147 mEq/L   Potassium 4.3  3.7 - 5.3 mEq/L   Chloride 95 (*) 96 - 112 mEq/L   CO2 22  19 - 32 mEq/L   Glucose, Bld 257 (*) 70 - 99 mg/dL   BUN 16  6 - 23 mg/dL   Creatinine, Ser 7.82  0.50 - 1.10 mg/dL   Calcium 9.9  8.4 - 95.6 mg/dL   GFR calc non Af Amer 71 (*) >90 mL/min   GFR calc Af Amer 82 (*) >90 mL/min  GLUCOSE, CAPILLARY     Status: Abnormal   Collection Time    06/15/13 12:46 AM      Result Value Ref Range   Glucose-Capillary 234 (*) 70 - 99 mg/dL     EKG: nsr, no acute ischemic changes, normal intervals, normal axis, normal qrs complex  MDM   Patient with 1) cellulitis of the right lower leg with lymphangitis. We are treating empirically with Clindamycin. Results of CBC pending. The patient is afebrile and should be stable for outpatient management with plan for f/u in the next 24 hrs.  2) hypertensive urgency - likely due, in part, to rebound effects of missed Clonidine dose. We are treating with Clonidine and HCTZ - pt's home meds and will re-evaluate. No signs or sx of end organ damage.   0205: Patient re-examined. Pain has imprved. The patient remains afebrile. Pulse now in 80s. BP improved to 150s/90s. WBC wnl. Patient is stable for outpatient tx with Clindamycin with instructions given to elevate as much as possible, f/u with PCP in  1-2 days for re-exam and return promptly to the ED for worsening sx.   Brandt Loosen, MD 06/15/13 [phone]

## 2013-07-09 ENCOUNTER — Encounter (HOSPITAL_COMMUNITY): Payer: Self-pay | Admitting: Emergency Medicine

## 2013-07-09 ENCOUNTER — Inpatient Hospital Stay (HOSPITAL_COMMUNITY)
Admission: EM | Admit: 2013-07-09 | Discharge: 2013-07-11 | DRG: 603 | Disposition: A | Discharge status: Home / Self Care | Payer: 59 | Attending: Internal Medicine | Admitting: Internal Medicine

## 2013-07-09 DIAGNOSIS — Z833 Family history of diabetes mellitus: Secondary | ICD-10-CM

## 2013-07-09 DIAGNOSIS — IMO0001 Reserved for inherently not codable concepts without codable children: Secondary | ICD-10-CM | POA: Diagnosis present

## 2013-07-09 DIAGNOSIS — I10 Essential (primary) hypertension: Secondary | ICD-10-CM | POA: Diagnosis present

## 2013-07-09 DIAGNOSIS — L0390 Cellulitis, unspecified: Secondary | ICD-10-CM

## 2013-07-09 DIAGNOSIS — L03113 Cellulitis of right upper limb: Secondary | ICD-10-CM

## 2013-07-09 DIAGNOSIS — IMO0002 Reserved for concepts with insufficient information to code with codable children: Principal | ICD-10-CM

## 2013-07-09 DIAGNOSIS — Z9119 Patient's noncompliance with other medical treatment and regimen: Secondary | ICD-10-CM

## 2013-07-09 DIAGNOSIS — R011 Cardiac murmur, unspecified: Secondary | ICD-10-CM | POA: Diagnosis present

## 2013-07-09 DIAGNOSIS — D649 Anemia, unspecified: Secondary | ICD-10-CM

## 2013-07-09 DIAGNOSIS — Z79899 Other long term (current) drug therapy: Secondary | ICD-10-CM

## 2013-07-09 DIAGNOSIS — Z888 Allergy status to other drugs, medicaments and biological substances status: Secondary | ICD-10-CM

## 2013-07-09 DIAGNOSIS — L03119 Cellulitis of unspecified part of limb: Secondary | ICD-10-CM | POA: Diagnosis present

## 2013-07-09 DIAGNOSIS — E1165 Type 2 diabetes mellitus with hyperglycemia: Secondary | ICD-10-CM

## 2013-07-09 DIAGNOSIS — I959 Hypotension, unspecified: Secondary | ICD-10-CM | POA: Diagnosis present

## 2013-07-09 DIAGNOSIS — Z87891 Personal history of nicotine dependence: Secondary | ICD-10-CM

## 2013-07-09 DIAGNOSIS — L02519 Cutaneous abscess of unspecified hand: Secondary | ICD-10-CM | POA: Diagnosis present

## 2013-07-09 DIAGNOSIS — Z8249 Family history of ischemic heart disease and other diseases of the circulatory system: Secondary | ICD-10-CM

## 2013-07-09 DIAGNOSIS — E7800 Pure hypercholesterolemia, unspecified: Secondary | ICD-10-CM | POA: Diagnosis present

## 2013-07-09 DIAGNOSIS — D509 Iron deficiency anemia, unspecified: Secondary | ICD-10-CM | POA: Diagnosis present

## 2013-07-09 DIAGNOSIS — E119 Type 2 diabetes mellitus without complications: Secondary | ICD-10-CM | POA: Diagnosis present

## 2013-07-09 DIAGNOSIS — Z91199 Patient's noncompliance with other medical treatment and regimen due to unspecified reason: Secondary | ICD-10-CM

## 2013-07-09 LAB — CBC
HCT: 35.5 % — ABNORMAL LOW (ref 36.0–46.0)
Hemoglobin: 12.3 g/dL (ref 12.0–15.0)
MCH: 21.7 pg — ABNORMAL LOW (ref 26.0–34.0)
MCHC: 34.6 g/dL (ref 30.0–36.0)
MCV: 62.7 fL — ABNORMAL LOW (ref 78.0–100.0)
PLATELETS: 377 10*3/uL (ref 150–400)
RBC: 5.66 MIL/uL — ABNORMAL HIGH (ref 3.87–5.11)
RDW: 17 % — AB (ref 11.5–15.5)
WBC: 10.8 10*3/uL — AB (ref 4.0–10.5)

## 2013-07-09 LAB — BASIC METABOLIC PANEL
BUN: 12 mg/dL (ref 6–23)
CHLORIDE: 100 meq/L (ref 96–112)
CO2: 19 mEq/L (ref 19–32)
Calcium: 10 mg/dL (ref 8.4–10.5)
Creatinine, Ser: 0.77 mg/dL (ref 0.50–1.10)
Glucose, Bld: 214 mg/dL — ABNORMAL HIGH (ref 70–99)
POTASSIUM: 4.1 meq/L (ref 3.7–5.3)
SODIUM: 137 meq/L (ref 137–147)

## 2013-07-09 LAB — GLUCOSE, CAPILLARY: GLUCOSE-CAPILLARY: 301 mg/dL — AB (ref 70–99)

## 2013-07-09 LAB — CBG MONITORING, ED: GLUCOSE-CAPILLARY: 199 mg/dL — AB (ref 70–99)

## 2013-07-09 MED ORDER — HYDROCODONE-ACETAMINOPHEN 5-325 MG PO TABS: 1.0000 | ORAL_TABLET | ORAL | Status: DC | PRN

## 2013-07-09 MED ORDER — CLONIDINE HCL 0.3 MG PO TABS
0.3000 mg | ORAL_TABLET | Freq: Two times a day (BID) | ORAL | Status: DC
  Administered 2013-07-09 – 2013-07-11 (×4): 0.3 mg via ORAL
  Filled 2013-07-09 (×5): qty 1

## 2013-07-09 MED ORDER — IRBESARTAN 300 MG PO TABS
300.0000 mg | ORAL_TABLET | Freq: Every day | ORAL | Status: DC
  Administered 2013-07-09 – 2013-07-11 (×3): 300 mg via ORAL
  Filled 2013-07-09 (×3): qty 1

## 2013-07-09 MED ORDER — ONDANSETRON HCL 4 MG/2ML IJ SOLN: 4.0000 mg | Freq: Four times a day (QID) | INTRAMUSCULAR | Status: DC | PRN

## 2013-07-09 MED ORDER — VANCOMYCIN HCL IN DEXTROSE 1-5 GM/200ML-% IV SOLN: 1000.0000 mg | Freq: Once | INTRAVENOUS | Status: DC

## 2013-07-09 MED ORDER — VANCOMYCIN HCL IN DEXTROSE 1-5 GM/200ML-% IV SOLN
1000.0000 mg | Freq: Three times a day (TID) | INTRAVENOUS | Status: DC
  Administered 2013-07-10 – 2013-07-11 (×5): 1000 mg via INTRAVENOUS
  Filled 2013-07-09 (×8): qty 200

## 2013-07-09 MED ORDER — PIPERACILLIN-TAZOBACTAM 3.375 G IVPB
3.3750 g | Freq: Three times a day (TID) | INTRAVENOUS | Status: DC
  Administered 2013-07-10 – 2013-07-11 (×4): 3.375 g via INTRAVENOUS
  Filled 2013-07-09 (×7): qty 50

## 2013-07-09 MED ORDER — ENOXAPARIN SODIUM 40 MG/0.4ML ~~LOC~~ SOLN
40.0000 mg | SUBCUTANEOUS | Status: DC
  Administered 2013-07-09 – 2013-07-10 (×2): 40 mg via SUBCUTANEOUS
  Filled 2013-07-09 (×3): qty 0.4

## 2013-07-09 MED ORDER — CLONIDINE HCL 0.2 MG PO TABS
0.3000 mg | ORAL_TABLET | Freq: Once | ORAL | Status: AC
  Administered 2013-07-09: 0.3 mg via ORAL
  Filled 2013-07-09 (×2): qty 1

## 2013-07-09 MED ORDER — CLINDAMYCIN PHOSPHATE 900 MG/50ML IV SOLN
900.0000 mg | Freq: Once | INTRAVENOUS | Status: AC
  Administered 2013-07-09: 900 mg via INTRAVENOUS
  Filled 2013-07-09: qty 50

## 2013-07-09 MED ORDER — PIPERACILLIN-TAZOBACTAM 3.375 G IVPB 30 MIN
3.3750 g | Freq: Once | INTRAVENOUS | Status: AC
  Administered 2013-07-09: 3.375 g via INTRAVENOUS
  Filled 2013-07-09: qty 50

## 2013-07-09 MED ORDER — INSULIN ASPART 100 UNIT/ML ~~LOC~~ SOLN
0.0000 [IU] | Freq: Three times a day (TID) | SUBCUTANEOUS | Status: DC
  Administered 2013-07-10: 5 [IU] via SUBCUTANEOUS

## 2013-07-09 MED ORDER — HYDROCODONE-ACETAMINOPHEN 5-325 MG PO TABS
2.0000 | ORAL_TABLET | Freq: Once | ORAL | Status: AC
  Administered 2013-07-09: 2 via ORAL
  Filled 2013-07-09: qty 2

## 2013-07-09 MED ORDER — HYDROCHLOROTHIAZIDE 12.5 MG PO CAPS: 12.5000 mg | ORAL_CAPSULE | Freq: Every day | ORAL | Status: DC

## 2013-07-09 MED ORDER — SODIUM CHLORIDE 0.9 % IV BOLUS (SEPSIS)
500.0000 mL | Freq: Once | INTRAVENOUS | Status: AC
  Administered 2013-07-09: 500 mL via INTRAVENOUS

## 2013-07-09 MED ORDER — INSULIN ASPART 100 UNIT/ML ~~LOC~~ SOLN
0.0000 [IU] | Freq: Every day | SUBCUTANEOUS | Status: DC
  Administered 2013-07-10: 4 [IU] via SUBCUTANEOUS

## 2013-07-09 MED ORDER — HYDROCHLOROTHIAZIDE 12.5 MG PO CAPS
12.5000 mg | ORAL_CAPSULE | Freq: Every day | ORAL | Status: DC
  Administered 2013-07-09 – 2013-07-11 (×3): 12.5 mg via ORAL
  Filled 2013-07-09 (×4): qty 1

## 2013-07-09 MED ORDER — ONDANSETRON HCL 4 MG PO TABS: 4.0000 mg | ORAL_TABLET | Freq: Four times a day (QID) | ORAL | Status: DC | PRN

## 2013-07-09 MED ORDER — DOCUSATE SODIUM 100 MG PO CAPS
100.0000 mg | ORAL_CAPSULE | Freq: Two times a day (BID) | ORAL | Status: DC
  Administered 2013-07-10 – 2013-07-11 (×4): 100 mg via ORAL
  Filled 2013-07-09 (×6): qty 1

## 2013-07-09 MED ORDER — FERROUS SULFATE 325 (65 FE) MG PO TABS
325.0000 mg | ORAL_TABLET | Freq: Every day | ORAL | Status: DC
  Administered 2013-07-10 – 2013-07-11 (×2): 325 mg via ORAL
  Filled 2013-07-09 (×3): qty 1

## 2013-07-09 MED ORDER — ACETAMINOPHEN 325 MG PO TABS: 650.0000 mg | ORAL_TABLET | Freq: Four times a day (QID) | ORAL | Status: DC | PRN

## 2013-07-09 MED ORDER — DIPHENHYDRAMINE HCL 25 MG PO CAPS
25.0000 mg | ORAL_CAPSULE | Freq: Four times a day (QID) | ORAL | Status: DC | PRN
  Administered 2013-07-10 – 2013-07-11 (×4): 25 mg via ORAL
  Filled 2013-07-09 (×4): qty 1

## 2013-07-09 MED ORDER — ACETAMINOPHEN 650 MG RE SUPP: 650.0000 mg | Freq: Four times a day (QID) | RECTAL | Status: DC | PRN

## 2013-07-09 NOTE — ED Notes (Addendum)
Dr. Doutova at bedside.  

## 2013-07-09 NOTE — Progress Notes (Signed)
ANTIBIOTIC CONSULT NOTE - INITIAL  Pharmacy Consult for Vancomycin and Zosyn Indication: Cellulitis of right hand  Allergies  Allergen Reactions  . Lisinopril Cough    Patient Measurements: Height: 5\' 8"  (172.7 cm) Weight: 218 lb 1.6 oz (98.93 kg) IBW/kg (Calculated) : 63.9   Vital Signs: Temp: 98.4 F (36.9 C) (03/15 2133) Temp src: Oral (03/15 2133) BP: 172/98 mmHg (03/15 2133) Pulse Rate: 85 (03/15 2133) Intake/Output from previous day:   Intake/Output from this shift:    Labs:  Recent Labs  07/09/13 1833  WBC 10.8*  HGB 12.3  PLT 377  CREATININE 0.77   Estimated Creatinine Clearance: 109.2 ml/min (by C-G formula based on Cr of 0.77). No results found for this basename: VANCOTROUGH, VANCOPEAK, VANCORANDOM, GENTTROUGH, GENTPEAK, GENTRANDOM, TOBRATROUGH, TOBRAPEAK, TOBRARND, AMIKACINPEAK, AMIKACINTROU, AMIKACIN,  in the last 72 hours   Microbiology: No results found for this or any previous visit (from the past 720 hour(s)).  Medical History: Past Medical History  Diagnosis Date  . Hypertension   . Diabetes mellitus   . High cholesterol   . Anemia     Medications:  Prescriptions prior to admission  Medication Sig Dispense Refill  . cloNIDine (CATAPRES) 0.3 MG tablet Take 0.3 mg by mouth 2 (two) times daily.      . ferrous sulfate 325 (65 FE) MG tablet Take 325 mg by mouth daily with breakfast.      . hydrochlorothiazide (MICROZIDE) 12.5 MG capsule Take 1 capsule (12.5 mg total) by mouth daily.  30 capsule  0  . metFORMIN (GLUCOPHAGE) 500 MG tablet Take 500 mg by mouth 2 (two) times daily with a meal.      . naproxen sodium (ANAPROX) 220 MG tablet Take 220 mg by mouth 2 (two) times daily as needed (for cramps).      . olmesartan (BENICAR) 40 MG tablet Take 40 mg by mouth daily.       Assessment: 43 y.o female with past medical history of HTN, DM, HLD and Anemia. Presented with 1 day hx of right hand swelling and itching. Swelling has progressed rapidly  and she started to have a red streak running up her arm. SCr 0.77, Estimated CrCl >100 ml/min    Goal of Therapy:  Vancomycin trough level 10-15 mcg/ml  Plan:  Zosyn 3.375 g IV q8h (4h infusion) Vancomycin 1000 mg IV q8h Monitor clinical status, renal function & culture results daily. Monitor vancomycin steady state trough prn.  Noah Delaineuth Joram Venson, RPh Clinical Pharmacist Pager: [phone] 07/09/2013,9:39 PM

## 2013-07-09 NOTE — ED Provider Notes (Signed)
CSN: [id number]     Arrival date & time 07/09/13  1756 History   First MD Initiated Contact with Patient 07/09/13 1818     Chief Complaint  Patient presents with  . Hand Problem     (Consider location/radiation/quality/duration/timing/severity/associated sxs/prior Treatment) The history is provided by the patient and medical records. No language interpreter was used.    Tracey Cordova is a 43 y.o. female  with a hx of non-insulin-dependent diabetes, hypertension and high cholesterol presents to the Emergency Department complaining of gradual, persistent, progressively worsening swelling of the right hand beginning with the right little finger onset upon awakening this morning. Patient reports she thinks she was bitten by an insect yesterday on the right hand. When she woke this morning and was swollen red and throughout the day the redness has extended up her arm to the medial portion of the upper arm. She denies fever, chills, nausea, vomiting. She reports she has been out of her hypertension medications for approximately 2 weeks. She reports she noticed her blood pressure is high. Nothing makes the pain in her hand better and palpation and movement makes it worse.  She denies chest pain, shortness of breath, headache, neck pain, neck stiffness abdominal pain, nausea, vomiting, diarrhea, weakness, dizziness, syncope.      Past Medical History  Diagnosis Date  . Hypertension   . Diabetes mellitus   . High cholesterol   . Anemia    Past Surgical History  Procedure Laterality Date  . Tubal ligation    . Tonsillectomy  2000   Family History  Problem Relation Age of Onset  . Other Mother    History  Substance Use Topics  . Smoking status: Former Smoker    Quit date: 08/21/2011  . Smokeless tobacco: Not on file  . Alcohol Use: Yes     Comment: Occassional Use   OB History   Grav Para Term Preterm Abortions TAB SAB Ect Mult Living                 Review of Systems   Constitutional: Negative for fever, chills, diaphoresis, appetite change, fatigue and unexpected weight change.  HENT: Negative for mouth sores.   Eyes: Negative for visual disturbance.  Respiratory: Negative for cough, chest tightness, shortness of breath and wheezing.   Cardiovascular: Negative for chest pain.  Gastrointestinal: Negative for nausea, vomiting, abdominal pain, diarrhea and constipation.  Endocrine: Negative for polydipsia, polyphagia and polyuria.  Genitourinary: Negative for dysuria, urgency, frequency and hematuria.  Musculoskeletal: Negative for back pain and neck stiffness.  Skin: Positive for color change (erythema). Negative for rash.  Allergic/Immunologic: Negative for immunocompromised state.  Neurological: Negative for syncope, light-headedness and headaches.  Hematological: Does not bruise/bleed easily.  Psychiatric/Behavioral: Negative for sleep disturbance. The patient is not nervous/anxious.       Allergies  Lisinopril  Home Medications   Current Outpatient Rx  Name  Route  Sig  Dispense  Refill  . cloNIDine (CATAPRES) 0.3 MG tablet   Oral   Take 0.3 mg by mouth 2 (two) times daily.         . ferrous sulfate 325 (65 FE) MG tablet   Oral   Take 325 mg by mouth daily with breakfast.         . hydrochlorothiazide (MICROZIDE) 12.5 MG capsule   Oral   Take 1 capsule (12.5 mg total) by mouth daily.   30 capsule   0   . metFORMIN (GLUCOPHAGE) 500 MG tablet  Oral   Take 500 mg by mouth 2 (two) times daily with a meal.         . naproxen sodium (ANAPROX) 220 MG tablet   Oral   Take 220 mg by mouth 2 (two) times daily as needed (for cramps).         . olmesartan (BENICAR) 40 MG tablet   Oral   Take 40 mg by mouth daily.          BP 147/75  Pulse 89  Temp(Src) 98.1 F (36.7 C) (Oral)  Resp 20  SpO2 99%  LMP 05/21/2013 Physical Exam  Nursing note and vitals reviewed. Constitutional: She is oriented to person, place, and time.  She appears well-developed and well-nourished. No distress.  Awake, alert, nontoxic appearance  HENT:  Head: Normocephalic and atraumatic.  Mouth/Throat: Oropharynx is clear and moist. No oropharyngeal exudate.  Eyes: Conjunctivae are normal. No scleral icterus.  Neck: Normal range of motion. Neck supple.  Cardiovascular: Regular rhythm, normal heart sounds and intact distal pulses.   No murmur heard. Tachycardia  Pulmonary/Chest: Effort normal and breath sounds normal. No respiratory distress. She has no wheezes.  Clear and equal breath sounds  Abdominal: Soft. Bowel sounds are normal. She exhibits no distension and no mass. There is no tenderness. There is no rebound and no guarding.  Abdomen soft and nontender  Musculoskeletal: Normal range of motion. She exhibits no edema.  Lymphadenopathy:    She has no cervical adenopathy.  Neurological: She is alert and oriented to person, place, and time.  Speech is clear and goal oriented Moves extremities without ataxia  Skin: Skin is warm and dry. She is not diaphoretic. There is erythema.  Significant swelling and erythema of the right little finger - erythema extends across the dorsum of the hand the dorsum of the forearm and up through the medial portion of the upper arm  Psychiatric: She has a normal mood and affect.    ED Course  Procedures (including critical care time) Labs Review Labs Reviewed  CBC - Abnormal; Notable for the following:    WBC 10.8 (*)    RBC 5.66 (*)    HCT 35.5 (*)    MCV 62.7 (*)    MCH 21.7 (*)    RDW 17.0 (*)    All other components within normal limits  BASIC METABOLIC PANEL - Abnormal; Notable for the following:    Glucose, Bld 214 (*)    All other components within normal limits  CBG MONITORING, ED - Abnormal; Notable for the following:    Glucose-Capillary 199 (*)    All other components within normal limits   Imaging Review No results found.   EKG Interpretation None      MDM   Final  diagnoses:  Cellulitis of right arm  DM2 (diabetes mellitus, type 2)  HTN (hypertension)   Tracey Cordova presents with cellulitis of the right little finger extending across the dorsum of the hand and up into the upper arm. Patient with a history of diabetes, non-insulin dependent putting her at risk for infection. Patient noted to be hypertensive in the emergency department.  No clinical signs of hypertensive urgency.  Patient given her home blood pressure medications. We will reassess.  Due to patient risk factor of diabetes and extent of erythema and streaking patient will likely need overnight admission for IV antibiotics.  8:17 PM Patient hypertension resolved with home medications.  She reports her pain is controlled at this time.  Erythema and swelling is slightly decreased after antibiotic administration.  Will admit for IV abx.   PCP: [facility]  The patient was discussed with and seen by Dr. Bernette Mayers who agrees with the treatment plan.   Dahlia Client Bryttany Tortorelli, PA-C 07/10/13 [phone]

## 2013-07-09 NOTE — Progress Notes (Signed)
Attempted to call for report. No answer. Will try again

## 2013-07-09 NOTE — ED Notes (Signed)
CBG 199 

## 2013-07-09 NOTE — ED Notes (Addendum)
Pt reports possible bug bite to right little finger yesterday, now has swelling to right hand and redness up her arm. Hypertensive at triage, states she has been out of bp meds x 2 weeks.

## 2013-07-09 NOTE — H&P (Signed)
PCP:  Karie Chimera, MD [facility]   Chief Complaint:  Right hand swelling  HPI: Tracey Cordova is a 43 y.o. female   has a past medical history of Hypertension; Diabetes mellitus; High cholesterol; and Anemia.   Presented with  1 day hx of right hand swelling and itching. This swelling has progressed rapidly and she started to have a red streak running up her arm. Initially she though it was a bug bite but given severity of swelling she presented to ER. Initially she was found to be hypertensive. She run out of her medications and her blood pressure has been elevated.  [facility] Denies any fever or chills. No chest pain, shortness of breath or headache Review of Systems:    Pertinent positives include: arm swelling  Constitutional:  No weight loss, night sweats, Fevers, chills, fatigue, weight loss  HEENT:  No headaches, Difficulty swallowing,Tooth/dental problems,Sore throat,  No sneezing, itching, ear ache, nasal congestion, post nasal drip,  Cardio-vascular:  No chest pain, Orthopnea, PND, anasarca, dizziness, palpitations.no Bilateral lower extremity swelling  GI:  No heartburn, indigestion, abdominal pain, nausea, vomiting, diarrhea, change in bowel habits, loss of appetite, melena, blood in stool, hematemesis Resp:  no shortness of breath at rest. No dyspnea on exertion, No excess mucus, no productive cough, No non-productive cough, No coughing up of blood.No change in color of mucus.No wheezing. Skin:  no rash or lesions. No jaundice GU:  no dysuria, change in color of urine, no urgency or frequency. No straining to urinate.  No flank pain.  Musculoskeletal:  No joint pain or no joint swelling. No decreased range of motion. No back pain.  Psych:  No change in mood or affect. No depression or anxiety. No memory loss.  Neuro: no localizing neurological complaints, no tingling, no weakness, no double vision, no gait abnormality, no  slurred speech, no confusion  Otherwise ROS are negative except for above, 10 systems were reviewed  Past Medical History: Past Medical History  Diagnosis Date  . Hypertension   . Diabetes mellitus   . High cholesterol   . Anemia    Past Surgical History  Procedure Laterality Date  . Tubal ligation    . Tonsillectomy  2000     Medications: Prior to Admission medications   Medication Sig Start Date End Date Taking? Authorizing Provider  cloNIDine (CATAPRES) 0.3 MG tablet Take 0.3 mg by mouth 2 (two) times daily.   Yes Historical Provider, MD  ferrous sulfate 325 (65 FE) MG tablet Take 325 mg by mouth daily with breakfast.   Yes Historical Provider, MD  hydrochlorothiazide (MICROZIDE) 12.5 MG capsule Take 1 capsule (12.5 mg total) by mouth daily. 01/23/12  Yes Zannie Cove, MD  metFORMIN (GLUCOPHAGE) 500 MG tablet Take 500 mg by mouth 2 (two) times daily with a meal.   Yes Historical Provider, MD  naproxen sodium (ANAPROX) 220 MG tablet Take 220 mg by mouth 2 (two) times daily as needed (for cramps).   Yes Historical Provider, MD  olmesartan (BENICAR) 40 MG tablet Take 40 mg by mouth daily.   Yes Historical Provider, MD    Allergies:   Allergies  Allergen Reactions  . Lisinopril Cough    Social History:  Ambulatory   Independently  Lives at  Home with sister   reports that she quit smoking about 22 months ago. She does not have any smokeless tobacco history on file. She reports that she drinks alcohol. She reports that she  does not use illicit drugs.   Family History: family history includes Diabetes type II in her mother; GER disease in her sister; High Cholesterol in her father and mother; Hypertension in her father and mother; Other in her mother; Ulcers in her son.    Physical Exam: Patient Vitals for the past 24 hrs:  BP Temp Temp src Pulse Resp SpO2  07/09/13 2045 141/89 mmHg - - 91 - 99 %  07/09/13 2000 147/75 mmHg - - 89 - 99 %  07/09/13 1957 179/70 mmHg  98.1 F (36.7 C) Oral 90 20 100 %  07/09/13 1915 206/102 mmHg - - 91 - 100 %  07/09/13 1841 251/112 mmHg - - - - -  07/09/13 1806 237/118 mmHg 98.4 F (36.9 C) Oral 104 18 99 %    1. General:  in No Acute distress 2. Psychological: Alert and  Oriented 3. Head/ENT:   Moist  Mucous Membranes                          Head Non traumatic, neck supple                          Normal  Dentition 4. SKIN: normal  Skin turgor,  Skin clean Dry and intact  Redness and warmth over right hand and streaking up the arm  5. Heart: Regular rate and rhythm holosystolic Murmur, Rub or gallop 6. Lungs: Clear to auscultation bilaterally, no wheezes or crackles   7. Abdomen: Soft, non-tender, Non distended 8. Lower extremities: no clubbing, cyanosis, or edema 9. Neurologically Grossly intact, moving all 4 extremities equally 10. MSK: Normal range of motion  body mass index is unknown because there is no weight on file.   Labs on Admission:   Recent Labs  07/09/13 1833  NA 137  K 4.1  CL 100  CO2 19  GLUCOSE 214*  BUN 12  CREATININE 0.77  CALCIUM 10.0   No results found for this basename: AST, ALT, ALKPHOS, BILITOT, PROT, ALBUMIN,  in the last 72 hours No results found for this basename: LIPASE, AMYLASE,  in the last 72 hours  Recent Labs  07/09/13 1833  WBC 10.8*  HGB 12.3  HCT 35.5*  MCV 62.7*  PLT 377   No results found for this basename: CKTOTAL, CKMB, CKMBINDEX, TROPONINI,  in the last 72 hours No results found for this basename: TSH, T4TOTAL, FREET3, T3FREE, THYROIDAB,  in the last 72 hours No results found for this basename: VITAMINB12, FOLATE, FERRITIN, TIBC, IRON, RETICCTPCT,  in the last 72 hours Lab Results  Component Value Date   HGBA1C 7.7* 01/22/2012    The CrCl is unknown because both a height and weight (above a minimum accepted value) are required for this calculation. ABG No results found for this basename: phart, pco2, po2, hco3, tco2, acidbasedef, o2sat     No  results found for this basename: DDIMER       Cultures: No results found for this basename: sdes, specrequest, cult, reptstatus       Radiological Exams on Admission: No results found.  Chart has been reviewed  Assessment/Plan  43 year old female with past medical history of hypertension diabetes presents with rapidly progressive right arm swelling    Present on Admission:  . Cellulitis of arm, right - given history of diabetes in rapid progression will treat with broad-spectrum antibiotics initially vancomycin and Zosyn . DM2 (diabetes mellitus, type  2) - sliding scale hold metformin for now  . HTN (hypertension) - restart home medications patient is sure that she can fill her prescriptions  . Anemia - continue iron supplement  . Heart murmur on physical examination - she have had a murmur as a child but it has not been followed up. Given hypotension and recurrent cellulitis will obtain echogram to evaluate this further   Prophylaxis: Lovenox,  CODE STATUS:FULL CODE  Other plan as per orders.  I have spent a total of 55 min on this admission  Shanequa Whitenight 07/09/2013, 8:59 PM

## 2013-07-10 LAB — CBC
HCT: 31.2 % — ABNORMAL LOW (ref 36.0–46.0)
HEMOGLOBIN: 10.7 g/dL — AB (ref 12.0–15.0)
MCH: 21.5 pg — AB (ref 26.0–34.0)
MCHC: 34.3 g/dL (ref 30.0–36.0)
MCV: 62.8 fL — ABNORMAL LOW (ref 78.0–100.0)
Platelets: 328 10*3/uL (ref 150–400)
RBC: 4.97 MIL/uL (ref 3.87–5.11)
RDW: 17.1 % — ABNORMAL HIGH (ref 11.5–15.5)
WBC: 7.4 10*3/uL (ref 4.0–10.5)

## 2013-07-10 LAB — COMPREHENSIVE METABOLIC PANEL
ALBUMIN: 3.3 g/dL — AB (ref 3.5–5.2)
ALT: 18 U/L (ref 0–35)
AST: 15 U/L (ref 0–37)
Alkaline Phosphatase: 59 U/L (ref 39–117)
BUN: 14 mg/dL (ref 6–23)
CO2: 22 mEq/L (ref 19–32)
CREATININE: 0.91 mg/dL (ref 0.50–1.10)
Calcium: 9 mg/dL (ref 8.4–10.5)
Chloride: 95 mEq/L — ABNORMAL LOW (ref 96–112)
GFR calc Af Amer: 87 mL/min — ABNORMAL LOW (ref >90)
GFR calc non Af Amer: 75 mL/min — ABNORMAL LOW (ref >90)
Glucose, Bld: 303 mg/dL — ABNORMAL HIGH (ref 70–99)
Potassium: 4.7 mEq/L (ref 3.7–5.3)
Sodium: 132 mEq/L — ABNORMAL LOW (ref 137–147)
Total Bilirubin: 0.4 mg/dL (ref 0.3–1.2)
Total Protein: 7 g/dL (ref 6.0–8.3)

## 2013-07-10 LAB — GLUCOSE, CAPILLARY
GLUCOSE-CAPILLARY: 244 mg/dL — AB (ref 70–99)
GLUCOSE-CAPILLARY: 155 mg/dL — AB (ref 70–99)
Glucose-Capillary: 250 mg/dL — ABNORMAL HIGH (ref 70–99)
Glucose-Capillary: 209 mg/dL — ABNORMAL HIGH (ref 70–99)

## 2013-07-10 LAB — TSH: TSH: 1.151 u[IU]/mL (ref 0.350–4.500)

## 2013-07-10 LAB — PHOSPHORUS: PHOSPHORUS: 3.3 mg/dL (ref 2.3–4.6)

## 2013-07-10 LAB — HEMOGLOBIN A1C
HEMOGLOBIN A1C: 9.2 % — AB (ref <5.7)
Mean Plasma Glucose: 217 mg/dL — ABNORMAL HIGH (ref <117)

## 2013-07-10 LAB — MAGNESIUM: Magnesium: 2 mg/dL (ref 1.5–2.5)

## 2013-07-10 MED ORDER — INSULIN GLARGINE 100 UNIT/ML ~~LOC~~ SOLN
10.0000 [IU] | Freq: Every day | SUBCUTANEOUS | Status: DC
  Administered 2013-07-11: 10 [IU] via SUBCUTANEOUS
  Filled 2013-07-10: qty 0.1

## 2013-07-10 MED ORDER — INSULIN GLARGINE 100 UNIT/ML ~~LOC~~ SOLN
5.0000 [IU] | Freq: Every day | SUBCUTANEOUS | Status: DC
  Administered 2013-07-10: 5 [IU] via SUBCUTANEOUS
  Filled 2013-07-10: qty 0.05

## 2013-07-10 MED ORDER — INSULIN ASPART 100 UNIT/ML ~~LOC~~ SOLN
0.0000 [IU] | Freq: Three times a day (TID) | SUBCUTANEOUS | Status: DC
  Administered 2013-07-10 – 2013-07-11 (×4): 7 [IU] via SUBCUTANEOUS

## 2013-07-10 MED ORDER — NAPROXEN 500 MG PO TABS
500.0000 mg | ORAL_TABLET | Freq: Two times a day (BID) | ORAL | Status: DC | PRN
  Administered 2013-07-10: 500 mg via ORAL
  Filled 2013-07-10: qty 1

## 2013-07-10 MED ORDER — INSULIN ASPART 100 UNIT/ML ~~LOC~~ SOLN
6.0000 [IU] | Freq: Three times a day (TID) | SUBCUTANEOUS | Status: DC
  Administered 2013-07-10 – 2013-07-11 (×3): 6 [IU] via SUBCUTANEOUS

## 2013-07-10 NOTE — Progress Notes (Signed)
Pt admitted to unit from ED. Pt is A&O and family is currently at bedside. BP elevated. Will give BP meds when they arrive from pharmacy. R hand/arm is pink, swollen, and warm to touch. Pt oriented to unit, call bell within reach, and currently resting comfortably in bed. Will continue to monitor.

## 2013-07-10 NOTE — Progress Notes (Signed)
Pt was educated on the administration of insulin. Pt was explained the importance of rotating sites and was able to successfully administer her insulin. All questions were answered.

## 2013-07-10 NOTE — Progress Notes (Signed)
PROGRESS NOTE  Tracey Cordova BOF:[id number] DOB: 1970-07-31 DOA: 07/09/2013 PCP: Karie Chimera, MD  HPI/Subjective: 43 yo female with history of uncontrolled DM, htn, and recurrent cellulitis admitted 3/16 for 1 day hx of right hand swelling and itching. This swelling had progressed rapidly and she started to have a red streak running up her arm. Found to be hypertensive d/t not having her medications for 2 weeks.  Assessment/Plan:   Cellulitis of RUE -c/w IV vancomycin and Zosyn -day 2 - erythema and edema seems to have signifiantly improved, Now able to make a fist, no tenderness in passive extension of his right hand and fingers. - Erythema in the demarcated areas has essentially resolved, mild erythema persists in the right index finger, with mild swelling of the right dorsal hand  DM2 (diabetes mellitus, type 2) - c/w sliding scale (Resistant) added low dose lantus 3/16-increase to 10 units, add scheduled premeal Novolog - hold metformin for now  - A1C pending -  Patient admits to medication non-compliance.  HTN   - resume home meds   Microcytic Anemia - continue iron supplement  - chronic and stable for outpatient work up.  Doubt she has been taking iron.  Heart murmur on physical examination - reports she has had this from childhood but never followed up - echocardiogram pending  DVT Prophylaxis:  Subq lovenox   Code Status: Full Family Communication: none at bedside  Disposition Plan: d/c presumably tomorrow if stable    Objective: Filed Vitals:   07/09/13 2045 07/09/13 2133 07/10/13 0447 07/10/13 0950  BP: 141/89 172/98 108/70 134/83  Pulse: 91 85 72 79  Temp:  98.4 F (36.9 C) 97.7 F (36.5 C) 97.9 F (36.6 C)  TempSrc:  Oral Oral Oral  Resp:  20 20 20   Height:  5\' 8"  (1.727 m)    Weight:  98.93 kg (218 lb 1.6 oz)    SpO2: 99% 98% 99% 100%    Intake/Output Summary (Last 24 hours) at 07/10/13 1102 Last data filed at 07/10/13 0700  Gross per 24 hour    Intake      0 ml  Output      0 ml  Net      0 ml   Filed Weights   07/09/13 2133  Weight: 98.93 kg (218 lb 1.6 oz)    Exam: General: Well developed, well nourished, NAD, appears stated age  HEENT:  PERR, No pharyngeal erythema or exudates  Neck: Supple, no JVD, no masses  Cardiovascular: RRR, S1 S2 auscultated, mild systolic 2/6 murmur LSB Respiratory: Clear to auscultation bilaterally with equal chest rise  Abdomen: Soft, nontender, nondistended, + bowel sounds  Extremities: warm dry without cyanosis clubbing or edema.  Neuro: AAOx3 Skin: mild swelling to the right hand dorsum, mild erythema in the right little finger, do not see any erythema in the demarcated areas in the right upper extremity Psych: Normal affect and demeanor with intact judgement and insight   Data Reviewed: Basic Metabolic Panel:  Recent Labs Lab 07/09/13 1833 07/10/13 0915  NA 137 132*  K 4.1 4.7  CL 100 95*  CO2 19 22  GLUCOSE 214* 303*  BUN 12 14  CREATININE 0.77 0.91  CALCIUM 10.0 9.0  MG  --  2.0  PHOS  --  3.3   CBC:  Recent Labs Lab 07/09/13 1833 07/10/13 0915  WBC 10.8* 7.4  HGB 12.3 10.7*  HCT 35.5* 31.2*  MCV 62.7* 62.8*  PLT 377 328  CBG:  Recent Labs Lab 07/09/13 1844 07/09/13 2359 07/10/13 0820  GLUCAP 199* 301* 244*      Studies: No results found.  Scheduled Meds: . cloNIDine  0.3 mg Oral BID  . docusate sodium  100 mg Oral BID  . enoxaparin (LOVENOX) injection  40 mg Subcutaneous Q24H  . ferrous sulfate  325 mg Oral Q breakfast  . hydrochlorothiazide  12.5 mg Oral Daily  . insulin aspart  0-20 Units Subcutaneous TID WC  . insulin aspart  0-5 Units Subcutaneous QHS  . insulin glargine  5 Units Subcutaneous Daily  . irbesartan  300 mg Oral Daily  . piperacillin-tazobactam (ZOSYN)  IV  3.375 g Intravenous 3 times per day  . vancomycin  1,000 mg Intravenous Q8H   Continuous Infusions:   Active Problems:   DM2 (diabetes mellitus, type 2)   HTN  (hypertension)   Anemia   Cellulitis of hand   Cellulitis of arm, right   Heart murmur on physical examination    Elease HashimotoJacqueline Kocot, PA-S  Algis DownsMarianne York, PA-C [facility] Pager [phone]. If 7PM-7AM, please contact night-coverage at [email or web address], password [email or web address] 07/10/2013, 11:02 AM  LOS: 1 day   Attending Patient seen and examined, agree with the assessment and plan as outlined above. Significantly improved, c/w Vanco/Zosyn.Suspect if improvement continues, home 3/17.  Windell NorfolkS Ghimire MD

## 2013-07-11 LAB — COMPREHENSIVE METABOLIC PANEL
ALT: 18 U/L (ref 0–35)
AST: 12 U/L (ref 0–37)
Albumin: 3.3 g/dL — ABNORMAL LOW (ref 3.5–5.2)
Alkaline Phosphatase: 67 U/L (ref 39–117)
BUN: 16 mg/dL (ref 6–23)
CALCIUM: 9.4 mg/dL (ref 8.4–10.5)
CO2: 23 mEq/L (ref 19–32)
Chloride: 98 mEq/L (ref 96–112)
Creatinine, Ser: 0.92 mg/dL (ref 0.50–1.10)
GFR calc non Af Amer: 74 mL/min — ABNORMAL LOW (ref >90)
GFR, EST AFRICAN AMERICAN: 86 mL/min — AB (ref >90)
GLUCOSE: 232 mg/dL — AB (ref 70–99)
Potassium: 4.5 mEq/L (ref 3.7–5.3)
Sodium: 135 mEq/L — ABNORMAL LOW (ref 137–147)
TOTAL PROTEIN: 7.3 g/dL (ref 6.0–8.3)
Total Bilirubin: 0.3 mg/dL (ref 0.3–1.2)

## 2013-07-11 LAB — GLUCOSE, CAPILLARY
Glucose-Capillary: 212 mg/dL — ABNORMAL HIGH (ref 70–99)
Glucose-Capillary: 237 mg/dL — ABNORMAL HIGH (ref 70–99)

## 2013-07-11 MED ORDER — SITAGLIPTIN PHOS-METFORMIN HCL 50-1000 MG PO TABS: 1.0000 | ORAL_TABLET | Freq: Two times a day (BID) | ORAL | Status: DC

## 2013-07-11 MED ORDER — HYDROCHLOROTHIAZIDE 12.5 MG PO CAPS: 12.5000 mg | ORAL_CAPSULE | Freq: Every day | ORAL | Status: DC

## 2013-07-11 MED ORDER — OLMESARTAN MEDOXOMIL 40 MG PO TABS: 40.0000 mg | ORAL_TABLET | Freq: Every day | ORAL | Status: DC

## 2013-07-11 MED ORDER — DOXYCYCLINE HYCLATE 50 MG PO CAPS: 50.0000 mg | ORAL_CAPSULE | Freq: Two times a day (BID) | ORAL | Status: DC

## 2013-07-11 MED ORDER — CLONIDINE HCL 0.3 MG PO TABS: 0.3000 mg | ORAL_TABLET | Freq: Two times a day (BID) | ORAL | Status: DC

## 2013-07-11 MED ORDER — SULFAMETHOXAZOLE-TMP DS 800-160 MG PO TABS: 1.0000 | ORAL_TABLET | Freq: Two times a day (BID) | ORAL | Status: DC

## 2013-07-11 MED ORDER — LINEZOLID 600 MG PO TABS: 600.0000 mg | ORAL_TABLET | Freq: Two times a day (BID) | ORAL | Status: DC

## 2013-07-11 NOTE — Discharge Summary (Deleted)
Physician Discharge Summary  Tracey Cordova:[id number] DOB: 1971-03-18 DOA: 07/09/2013  PCP: Karie Chimera, MD  Admit date: 07/09/2013 Discharge date: 07/11/2013  Time spent: 45 minutes  Recommendations for Outpatient Follow-up:  1.  (include homehealth, outpatient follow-up instructions, specific recommendations for PCP to follow-up on, etc.)  Discharge Diagnoses:  Active Problems:   DM2 (diabetes mellitus, type 2)   HTN (hypertension)   Anemia   Cellulitis of hand   Cellulitis of arm, right   Heart murmur on physical examination   Discharge Condition:   Diet recommendation:   Filed Weights   07/09/13 2133  Weight: 98.93 kg (218 lb 1.6 oz)    History of present illness:  Hospital Course:    Procedures:   (i.e. Studies not automatically included, echos, thoracentesis, etc; not x-rays)  Consultations:    Discharge Exam: Filed Vitals:   07/11/13 0641  BP: 112/73  Pulse: 68  Temp: 98 F (36.7 C)  Resp: 16    General: Well developed, well nourished, NAD, appears stated age  HEENT: PERR, No pharyngeal erythema or exudates  Neck: Supple, no JVD, no masses  Cardiovascular: RRR, S1 S2 auscultated, mild systolic 2/6 murmur LSB  Respiratory: Clear to auscultation bilaterally with equal chest rise  Abdomen: Soft, nontender, nondistended, + bowel sounds  Extremities: warm dry without cyanosis clubbing or edema.  Neuro: AAOx3  Skin: decreased swelling to the right hand dorsum, mild erythema in the right little finger extending up the forearm Psych: Normal affect and demeanor with intact judgement and insight   Discharge Instructions  Discharge Orders   Future Orders Complete By Expires   Diet Carb Modified  As directed    Increase activity slowly  As directed        Medication List         cloNIDine 0.3 MG tablet  Commonly known as:  CATAPRES  Take 0.3 mg by mouth 2 (two) times daily.     ferrous sulfate 325 (65 FE) MG tablet  Take 325 mg by mouth  daily with breakfast.     hydrochlorothiazide 12.5 MG capsule  Commonly known as:  MICROZIDE  Take 1 capsule (12.5 mg total) by mouth daily.     linezolid 600 MG tablet  Commonly known as:  ZYVOX  Take 1 tablet (600 mg total) by mouth 2 (two) times daily.     metFORMIN 500 MG tablet  Commonly known as:  GLUCOPHAGE  Take 500 mg by mouth 2 (two) times daily with a meal.     naproxen sodium 220 MG tablet  Commonly known as:  ANAPROX  Take 220 mg by mouth 2 (two) times daily as needed (for cramps).     olmesartan 40 MG tablet  Commonly known as:  BENICAR  Take 40 mg by mouth daily.     sitaGLIPtin-metformin 50-1000 MG per tablet  Commonly known as:  JANUMET  Take 1 tablet by mouth 2 (two) times daily with a meal.       Allergies  Allergen Reactions  . Lisinopril Cough       Follow-up Information   Follow up with REESE,BETTI D, MD In 1 week.   Specialty:  Family Medicine   Contact information:   [address] [place] [address] [phone]        The results of significant diagnostics from this hospitalization (including imaging, microbiology, ancillary and laboratory) are listed below for reference.      Labs: Basic Metabolic Panel:  Recent Labs Lab 07/09/13 1833 07/10/13 0915  NA 137 132*  K 4.1 4.7  CL 100 95*  CO2 19 22  GLUCOSE 214* 303*  BUN 12 14  CREATININE 0.77 0.91  CALCIUM 10.0 9.0  MG  --  2.0  PHOS  --  3.3   Liver Function Tests:  Recent Labs Lab 07/10/13 0915  AST 15  ALT 18  ALKPHOS 59  BILITOT 0.4  PROT 7.0  ALBUMIN 3.3*   CBC:  Recent Labs Lab 07/09/13 1833 07/10/13 0915  WBC 10.8* 7.4  HGB 12.3 10.7*  HCT 35.5* 31.2*  MCV 62.7* 62.8*  PLT 377 328   CBG:  Recent Labs Lab 07/10/13 0820 07/10/13 1159 07/10/13 1642 07/10/13 2150 07/11/13 0816  GLUCAP 244* 250* 209* 155* 212*       Signed:  Conley CanalYork, Dameka Younker L, PA-C [phone]  [facility] 07/11/2013, 9:13 AM

## 2013-07-11 NOTE — Discharge Summary (Signed)
Physician Discharge Summary  Tracey Cordova EAV:[id number] DOB: 02/24/1971 DOA: 07/09/2013  PCP: Karie ChimeraEESE,BETTI D, MD  Admit date: 07/09/2013 Discharge date: 07/11/2013  Time spent: 45 minutes  Recommendations for Outpatient Follow-up:  1. Follow up with PCP in one week to discuss resolution of cellulitis and tighter diabetic management 2. Discuss outpatient follow-up echocardiogram for heart murmur  3. Discuss need for chronic anemia workup  History of present illness:   Tracey Cordova is a 43 y.o. female with a past medical history of Hypertension, Diabetes mellitus, High cholesterol, and Anemia.  She presented with 1 day hx of right hand swelling and itching. This swelling progressed rapidly and she started to have a red streak running up her arm. Initially she though it was a bug bite but given severity of swelling she presented to ER. Initially she was found to be hypertensive. She ran out of her medications and her blood pressure has been elevated. Her blood sugars were found to be uncontrolled. She denied any fever or chills, chest pain, shortness of breath or headache.    Discharge Diagnoses:   Cellulitis of RUE  - Erythema and edema seems to have signifiantly improved- able to make a fist, no tenderness in passive extension of  right hand and fingers.  - Erythema in the demarcated areas has essentially resolved, mild erythema persists in the right index finger, with very minimal swelling of the right dorsal hand  -Patient treated with 2 days of IV vancomycin and zosyn -Initate bactrim and doxycycline on discharge -Benadryl as needed for itching  DM2 (diabetes mellitus, type 2)  -Uncontrolled on admission and noncompliant with medications -A1C 9.2.    -Initiate Janumet upon discharge.  Patient will need PCP follow up for tighter DM control  HTN  -Stable  -Continue clonidine, HCTZ and benicar  Microcytic Anemia  -Continue iron supplement  -Chronic and stable for outpatient work  up.   Heart murmur on physical examination  -Reports she has had this from childhood but never followed up  -Consider outpatient echocardiogram    Discharge Condition: Stable  Diet recommendation: Carb modified  Filed Weights   07/09/13 2133  Weight: 98.93 kg (218 lb 1.6 oz)    Discharge Exam: Filed Vitals:   07/11/13 1022  BP: 158/84  Pulse: 83  Temp: 98 F (36.7 C)  Resp: 18    General: Well developed, well nourished, NAD, appears stated age  HEENT: PERR, EOMI, No pharyngeal erythema or exudates  Neck: Supple, no JVD, no lymphadenopathy  Cardiovascular: RRR, S1 S2 auscultated, mild systolic 2/6 murmur LSB  Respiratory: Clear to auscultation bilaterally with equal chest rise  Abdomen: Soft, nontender, nondistended, + bowel sounds  Extremities: warm dry without cyanosis clubbing or edema.  Neuro: AAOx3  Skin: significantly decreased swelling to the right hand dorsum, slight erythema in the right little finger extending up the forearm that is much improved since yesterday and well within original demarcation Psych: Normal affect and demeanor with intact judgement and insight   Discharge Instructions      Discharge Orders   Future Orders Complete By Expires   Diet Carb Modified  As directed    Increase activity slowly  As directed        Medication List    STOP taking these medications       metFORMIN 500 MG tablet  Commonly known as:  GLUCOPHAGE      TAKE these medications       cloNIDine 0.3 MG tablet  Commonly known as:  CATAPRES  Take 1 tablet (0.3 mg total) by mouth 2 (two) times daily.     doxycycline 50 MG capsule  Commonly known as:  VIBRAMYCIN  Take 1 capsule (50 mg total) by mouth 2 (two) times daily.     ferrous sulfate 325 (65 FE) MG tablet  Take 325 mg by mouth daily with breakfast.     hydrochlorothiazide 12.5 MG capsule  Commonly known as:  MICROZIDE  Take 1 capsule (12.5 mg total) by mouth daily.     naproxen sodium 220 MG tablet   Commonly known as:  ANAPROX  Take 220 mg by mouth 2 (two) times daily as needed (for cramps).     olmesartan 40 MG tablet  Commonly known as:  BENICAR  Take 1 tablet (40 mg total) by mouth daily.     sitaGLIPtin-metformin 50-1000 MG per tablet  Commonly known as:  JANUMET  Take 1 tablet by mouth 2 (two) times daily with a meal.     sulfamethoxazole-trimethoprim 800-160 MG per tablet  Commonly known as:  BACTRIM DS  Take 1 tablet by mouth 2 (two) times daily.       Allergies  Allergen Reactions  . Lisinopril Cough   Follow-up Information   Follow up with REESE,BETTI D, MD In 1 week.   Specialty:  Family Medicine   Contact information:   [address] [place] [address] [phone]        The results of significant diagnostics from this hospitalization (including imaging, microbiology, ancillary and laboratory) are listed below for reference.      Labs: Basic Metabolic Panel:  Recent Labs Lab 07/09/13 1833 07/10/13 0915 07/11/13 0845  NA 137 132* 135*  K 4.1 4.7 4.5  CL 100 95* 98  CO2 19 22 23   GLUCOSE 214* 303* 232*  BUN 12 14 16   CREATININE 0.77 0.91 0.92  CALCIUM 10.0 9.0 9.4  MG  --  2.0  --   PHOS  --  3.3  --    Liver Function Tests:  Recent Labs Lab 07/10/13 0915 07/11/13 0845  AST 15 12  ALT 18 18  ALKPHOS 59 67  BILITOT 0.4 0.3  PROT 7.0 7.3  ALBUMIN 3.3* 3.3*   CBC:  Recent Labs Lab 07/09/13 1833 07/10/13 0915  WBC 10.8* 7.4  HGB 12.3 10.7*  HCT 35.5* 31.2*  MCV 62.7* 62.8*  PLT 377 328   CBG:  Recent Labs Lab 07/10/13 1159 07/10/13 1642 07/10/13 2150 07/11/13 0816 07/11/13 1107  GLUCAP 250* 209* 155* 212* 237*    Signed: Flora Lipps, [place] [phone]  [facility] 07/11/2013, 12:10 PM  Attending Patient seen and examined, and agree with the above assessment and plan. Significantly improved, very minimal swelling in the right dorsal hand remains, erythema has  essentially resolved. Stable for discharge.  Windell Norfolk MD

## 2013-07-11 NOTE — Care Management Note (Unsigned)
    Page 1 of 1   07/11/2013     12:30:43 PM   CARE MANAGEMENT NOTE 07/11/2013  Patient:  Tracey Cordova,[name]   Account Number:  [id number]  Date Initiated:  07/11/2013  Documentation initiated by:  Letha CapeAYLOR,Nitya Cauthon  Subjective/Objective Assessment:   dx cellulitis of hand  admit- lives with sister.     Action/Plan:   Anticipated DC Date:  07/11/2013   Anticipated DC Plan:  HOME/SELF CARE      DC Planning Services  CM consult      Choice offered to / List presented to:             Status of service:  In process, will continue to follow Medicare Important Message given?   (If response is "NO", the following Medicare IM given date fields will be blank) Date Medicare IM given:   Date Additional Medicare IM given:    Discharge Disposition:    Per UR Regulation:  Reviewed for med. necessity/level of care/duration of stay  If discussed at Long Length of Stay Meetings, dates discussed:    Comments:  07/11/13 1007 Letha Capeeborah Phallon Haydu RN, BSN [phone] patient lives with sister, patient is for dc today with zyvox 600mg  bid for 5 days, NCM awating benefits check to see if this is covered by insurance and if it needs prior authorization before patient is dc.  Per benefits check patient will have a co pay of $60 and this does not need prior authorization.  NCM informed RN of the co pay amt and he states patient said this will be a hardship for her. Informed RN that patient has insurance so she will need to pay the co pay.

## 2013-07-11 NOTE — Discharge Instructions (Signed)
Take your antibiotics as prescribed.   Please see Dr. Pecola Leisureeese to check your cellulitis and better control your Diabetes.

## 2013-07-11 NOTE — Progress Notes (Signed)
Inpatient Diabetes Program Recommendations  AACE/ADA: New Consensus Statement on Inpatient Glycemic Control (2013)  Target Ranges:  Prepandial:   less than 140 mg/dL      Peak postprandial:   less than 180 mg/dL (1-2 hours)      Critically ill patients:  140 - 180 mg/dL   Reason for Visit: Follow-up regarding insulin inst.  Show insulin pen  Diabetes history: type 2 Outpatient Diabetes medications: Metformin Current orders for Inpatient glycemic control: Lantus, Meal coverage, correction  Note:  Yesterday had asked nursing staff to let patient give own insulin injections.  Insulin instruction was done by staff.  Patient's insurance covers insulin pens.  Demonstrated basic use of an insulin pen to patient.  Patient states that she thinks MD plans to increase her metformin for discharge and follow-up with her PCP regarding need for insulin after discharge.  If patient needs insulin at some point, she would prefer the insulin pen for both basal and meal coverage/correction.  Suggested she watch the Pt Ed Video regarding use of insulin pens in case she is ordered insulin in the future.   Thank you.  Latona Krichbaum S. Elsie Lincolnouth, RN, CNS, CDE Inpatient Diabetes Program, team pager [phone]

## 2013-07-11 NOTE — Progress Notes (Signed)
Patient discharge teaching given, including activity, diet, follow-up appoints, and medications. Patient verbalized understanding of all discharge instructions. IV access was d/c'd. Vitals are stable. Skin is intact except as charted in most recent assessments. Pt to be escorted out by NT, to be driven home by family. 

## 2013-07-12 NOTE — ED Provider Notes (Signed)
Medical screening examination/treatment/procedure(s) were conducted as a shared visit with non-physician practitioner(s) and myself.  I personally evaluated the patient during the encounter.   EKG Interpretation None      Pt with hand cellulitis, streaking up her arm, no fever. H/O diabetes. Admit for Abx.    Jamion Carter B. Bernette MayersSheldon, MD 07/12/13 [phone]

## 2013-10-09 ENCOUNTER — Other Ambulatory Visit: Payer: Self-pay | Admitting: Family Medicine

## 2013-10-09 ENCOUNTER — Ambulatory Visit
Admission: RE | Admit: 2013-10-09 | Discharge: 2013-10-09 | Disposition: A | Discharge status: Home / Self Care | Payer: 59 | Source: Ambulatory Visit | Attending: Family Medicine | Admitting: Family Medicine

## 2013-10-09 DIAGNOSIS — M25562 Pain in left knee: Secondary | ICD-10-CM

## 2013-10-26 ENCOUNTER — Encounter (HOSPITAL_COMMUNITY): Payer: Self-pay | Admitting: Emergency Medicine

## 2013-10-26 ENCOUNTER — Emergency Department (HOSPITAL_COMMUNITY)
Admission: EM | Admit: 2013-10-26 | Discharge: 2013-10-26 | Disposition: A | Discharge status: Home / Self Care | Payer: 59 | Attending: Emergency Medicine | Admitting: Emergency Medicine

## 2013-10-26 DIAGNOSIS — R05 Cough: Secondary | ICD-10-CM | POA: Insufficient documentation

## 2013-10-26 DIAGNOSIS — R059 Cough, unspecified: Secondary | ICD-10-CM | POA: Insufficient documentation

## 2013-10-26 DIAGNOSIS — Z87891 Personal history of nicotine dependence: Secondary | ICD-10-CM | POA: Insufficient documentation

## 2013-10-26 DIAGNOSIS — D649 Anemia, unspecified: Secondary | ICD-10-CM | POA: Insufficient documentation

## 2013-10-26 DIAGNOSIS — I10 Essential (primary) hypertension: Secondary | ICD-10-CM | POA: Insufficient documentation

## 2013-10-26 DIAGNOSIS — J029 Acute pharyngitis, unspecified: Secondary | ICD-10-CM | POA: Insufficient documentation

## 2013-10-26 DIAGNOSIS — Z79899 Other long term (current) drug therapy: Secondary | ICD-10-CM | POA: Insufficient documentation

## 2013-10-26 DIAGNOSIS — E119 Type 2 diabetes mellitus without complications: Secondary | ICD-10-CM | POA: Insufficient documentation

## 2013-10-26 LAB — RAPID STREP SCREEN (MED CTR MEBANE ONLY): Streptococcus, Group A Screen (Direct): NEGATIVE

## 2013-10-26 MED ORDER — HYDROCODONE-ACETAMINOPHEN 7.5-325 MG/15ML PO SOLN: 15.0000 mL | Freq: Four times a day (QID) | ORAL | Status: AC | PRN

## 2013-10-26 MED ORDER — IBUPROFEN 600 MG PO TABS: 600.0000 mg | ORAL_TABLET | Freq: Four times a day (QID) | ORAL | Status: AC | PRN

## 2013-10-26 MED ORDER — KETOROLAC TROMETHAMINE 60 MG/2ML IM SOLN
60.0000 mg | Freq: Once | INTRAMUSCULAR | Status: AC
  Administered 2013-10-26: 60 mg via INTRAMUSCULAR
  Filled 2013-10-26: qty 2

## 2013-10-26 NOTE — ED Provider Notes (Signed)
CSN: [id number]     Arrival date & time 10/26/13  0246 History   First MD Initiated Contact with Patient 10/26/13 0255     Chief Complaint  Patient presents with  . Sore Throat     (Consider location/radiation/quality/duration/timing/severity/associated sxs/prior Treatment) HPI  This a 43 year old female with history of hypertension and diabetes who presents with sore throat. Patient states that she's had symptoms since Friday. She endorses dry cough and sore throat. She reports difficulty swallowing. She's been able to tolerate fluids and solids. She denies any fevers. She also reports nasal congestion. She denies any ear pain, difficulty breathing or chest pain. No known sick contacts.  Past Medical History  Diagnosis Date  . Hypertension   . Diabetes mellitus   . High cholesterol   . Anemia    Past Surgical History  Procedure Laterality Date  . Tubal ligation    . Tonsillectomy  2000   Family History  Problem Relation Age of Onset  . Other Mother   . Diabetes type II Mother   . Hypertension Mother   . High Cholesterol Mother   . Hypertension Father   . High Cholesterol Father   . GER disease Sister   . Ulcers Son    History  Substance Use Topics  . Smoking status: Former Smoker    Quit date: 08/21/2011  . Smokeless tobacco: Not on file  . Alcohol Use: Yes     Comment: Occassional Use   OB History   Grav Para Term Preterm Abortions TAB SAB Ect Mult Living                 Review of Systems  Constitutional: Negative for fever.  HENT: Positive for congestion and sore throat. Negative for ear pain.   Respiratory: Positive for cough. Negative for chest tightness and shortness of breath.   Cardiovascular: Negative for chest pain.  Gastrointestinal: Negative for nausea, vomiting and abdominal pain.  Genitourinary: Negative for dysuria.  Skin: Negative for wound.  Neurological: Negative for headaches.  All other systems reviewed and are negative.     Allergies   Lisinopril  Home Medications   Prior to Admission medications   Medication Sig Start Date End Date Taking? Authorizing Provider  cloNIDine (CATAPRES) 0.3 MG tablet Take 0.3 mg by mouth daily.   Yes Historical Provider, MD  ferrous sulfate 325 (65 FE) MG tablet Take 325 mg by mouth daily with breakfast.   Yes Historical Provider, MD  hydrochlorothiazide (MICROZIDE) 12.5 MG capsule Take 12.5 mg by mouth daily.   Yes Historical Provider, MD  naproxen sodium (ANAPROX) 220 MG tablet Take 220 mg by mouth 2 (two) times daily as needed (for cramps).   Yes Historical Provider, MD  olmesartan (BENICAR) 40 MG tablet Take 40 mg by mouth daily.   Yes Historical Provider, MD  Phenyleph-Doxylamine-DM-APAP (ALKA SELTZER PLUS PO) Take by mouth.   Yes Historical Provider, MD  sitaGLIPtin-metformin (JANUMET) 50-1000 MG per tablet Take 1 tablet by mouth 2 (two) times daily with a meal.   Yes Historical Provider, MD  HYDROcodone-acetaminophen (HYCET) 7.5-325 mg/15 ml solution Take 15 mLs by mouth every 6 (six) hours as needed for moderate pain or severe pain. 10/26/13   Shon Batonourtney F Horton, MD  ibuprofen (ADVIL,MOTRIN) 600 MG tablet Take 1 tablet (600 mg total) by mouth every 6 (six) hours as needed. 10/26/13   Shon Batonourtney F Horton, MD   BP 182/88  Pulse 117  Temp(Src) 98.3 F (36.8 C) (Oral)  Resp  18  SpO2 100%  LMP 10/02/2013 Physical Exam  Nursing note and vitals reviewed. Constitutional: She is oriented to person, place, and time. She appears well-developed and well-nourished. No distress.  HENT:  Head: Normocephalic and atraumatic.  Mouth/Throat: Oropharynx is clear and moist. No oropharyngeal exudate.  Mild erythema to posterior oropharynx, absence of tonsils  Eyes: Pupils are equal, round, and reactive to light.  Neck: Normal range of motion. Neck supple.  Cardiovascular: Normal rate, regular rhythm and normal heart sounds.   No murmur heard. Pulmonary/Chest: Effort normal and breath sounds normal. No  respiratory distress. She has no wheezes.  Abdominal: Soft. Bowel sounds are normal. There is no tenderness.  Lymphadenopathy:    She has no cervical adenopathy.  Neurological: She is alert and oriented to person, place, and time.  Skin: Skin is warm and dry.  Psychiatric: She has a normal mood and affect.    ED Course  Procedures (including critical care time) Labs Review Labs Reviewed  RAPID STREP SCREEN  CULTURE, GROUP A STREP    Imaging Review No results found.   EKG Interpretation None      MDM   Final diagnoses:  Viral pharyngitis    Patient presents with sore throat and cough. She is nontoxic on exam. Initial vital signs notable for heart rate of 117. Repeat vital signs with a pulse of 84. Patient shows no evidence of tonsillar exudate or fever. She also has a cough. Low-risk for strep throat but will screen at this time. Patient is driving. She was given IM Toradol. Strep screen is negative. Suspect patient's symptoms are likely related to acute viral syndrome given congestion and dry cough. Discussed this with the patient. We'll discharge home. Have recommended supportive care with ibuprofen. We'll also provide the patient a short course of hycet.  Patient stated understanding. She was given strict return precautions.  After history, exam, and medical workup I feel the patient has been appropriately medically screened and is safe for discharge home. Pertinent diagnoses were discussed with the patient. Patient was given return precautions.     Shon Batonourtney F Horton, MD 10/26/13 [phone]

## 2013-10-26 NOTE — Discharge Instructions (Signed)
Viral Pharyngitis  Viral pharyngitis is a viral infection that produces redness, pain, and swelling (inflammation) of the throat. It can spread from person to person (contagious).   CAUSES  Viral pharyngitis is caused by inhaling a large amount of certain germs called viruses. Many different viruses cause viral pharyngitis.  SYMPTOMS  Symptoms of viral pharyngitis include:  · Sore throat.  · Tiredness.  · Stuffy nose.  · Low-grade fever.  · Congestion.  · Cough.  TREATMENT  Treatment includes rest, drinking plenty of fluids, and the use of over-the-counter medication (approved by your caregiver).  HOME CARE INSTRUCTIONS   · Drink enough fluids to keep your urine clear or pale yellow.  · Eat soft, cold foods such as ice cream, frozen ice pops, or gelatin dessert.  · Gargle with warm salt water (1 tsp salt per 1 qt of water).  · If over age 7, throat lozenges may be used safely.  · Only take over-the-counter or prescription medicines for pain, discomfort, or fever as directed by your caregiver. Do not take aspirin.  To help prevent spreading viral pharyngitis to others, avoid:  · Mouth-to-mouth contact with others.  · Sharing utensils for eating and drinking.  · Coughing around others.  SEEK MEDICAL CARE IF:   · You are better in a few days, then become worse.  · You have a fever or pain not helped by pain medicines.  · There are any other changes that concern you.  Document Released: 01/21/2005 Document Revised: 07/06/2011 Document Reviewed: 06/19/2010  [employer]® Patient Information ©2015 [employer]. This information is not intended to replace advice given to you by your health care provider. Make sure you discuss any questions you have with your health care provider.

## 2013-10-26 NOTE — ED Notes (Signed)
Pt. reports sore throat with dry cough onset last Friday , denies fever or chills. Airway intact / respirations unlabored .

## 2013-10-26 NOTE — ED Notes (Signed)
Pt monitored by bp cuff, and pulse ox.

## 2013-10-27 LAB — CULTURE, GROUP A STREP

## 2014-02-15 ENCOUNTER — Other Ambulatory Visit: Payer: Self-pay | Admitting: Family Medicine

## 2014-02-15 DIAGNOSIS — R921 Mammographic calcification found on diagnostic imaging of breast: Secondary | ICD-10-CM

## 2014-03-05 ENCOUNTER — Ambulatory Visit
Admission: RE | Admit: 2014-03-05 | Discharge: 2014-03-05 | Disposition: A | Discharge status: Home / Self Care | Payer: 59 | Source: Ambulatory Visit | Attending: Family Medicine | Admitting: Family Medicine

## 2014-03-05 DIAGNOSIS — R921 Mammographic calcification found on diagnostic imaging of breast: Secondary | ICD-10-CM

## 2015-04-15 ENCOUNTER — Other Ambulatory Visit: Payer: Self-pay

## 2015-04-15 DIAGNOSIS — Z1231 Encounter for screening mammogram for malignant neoplasm of breast: Secondary | ICD-10-CM

## 2015-05-06 ENCOUNTER — Ambulatory Visit: Payer: Self-pay

## 2015-07-05 ENCOUNTER — Ambulatory Visit: Payer: Self-pay

## 2015-07-09 ENCOUNTER — Ambulatory Visit
Admission: RE | Admit: 2015-07-09 | Discharge: 2015-07-09 | Disposition: A | Discharge status: Home / Self Care | Payer: Self-pay | Source: Ambulatory Visit

## 2015-07-09 DIAGNOSIS — Z1231 Encounter for screening mammogram for malignant neoplasm of breast: Secondary | ICD-10-CM

## 2015-12-07 IMAGING — CR DG KNEE COMPLETE 4+V*L*
4 series · 4 of 4 positions shown · non-contrast
Comparison: None.

CLINICAL DATA: Left knee pain

EXAM:
LEFT KNEE - COMPLETE 4+ VIEW

[view not recorded (1 of 4)]
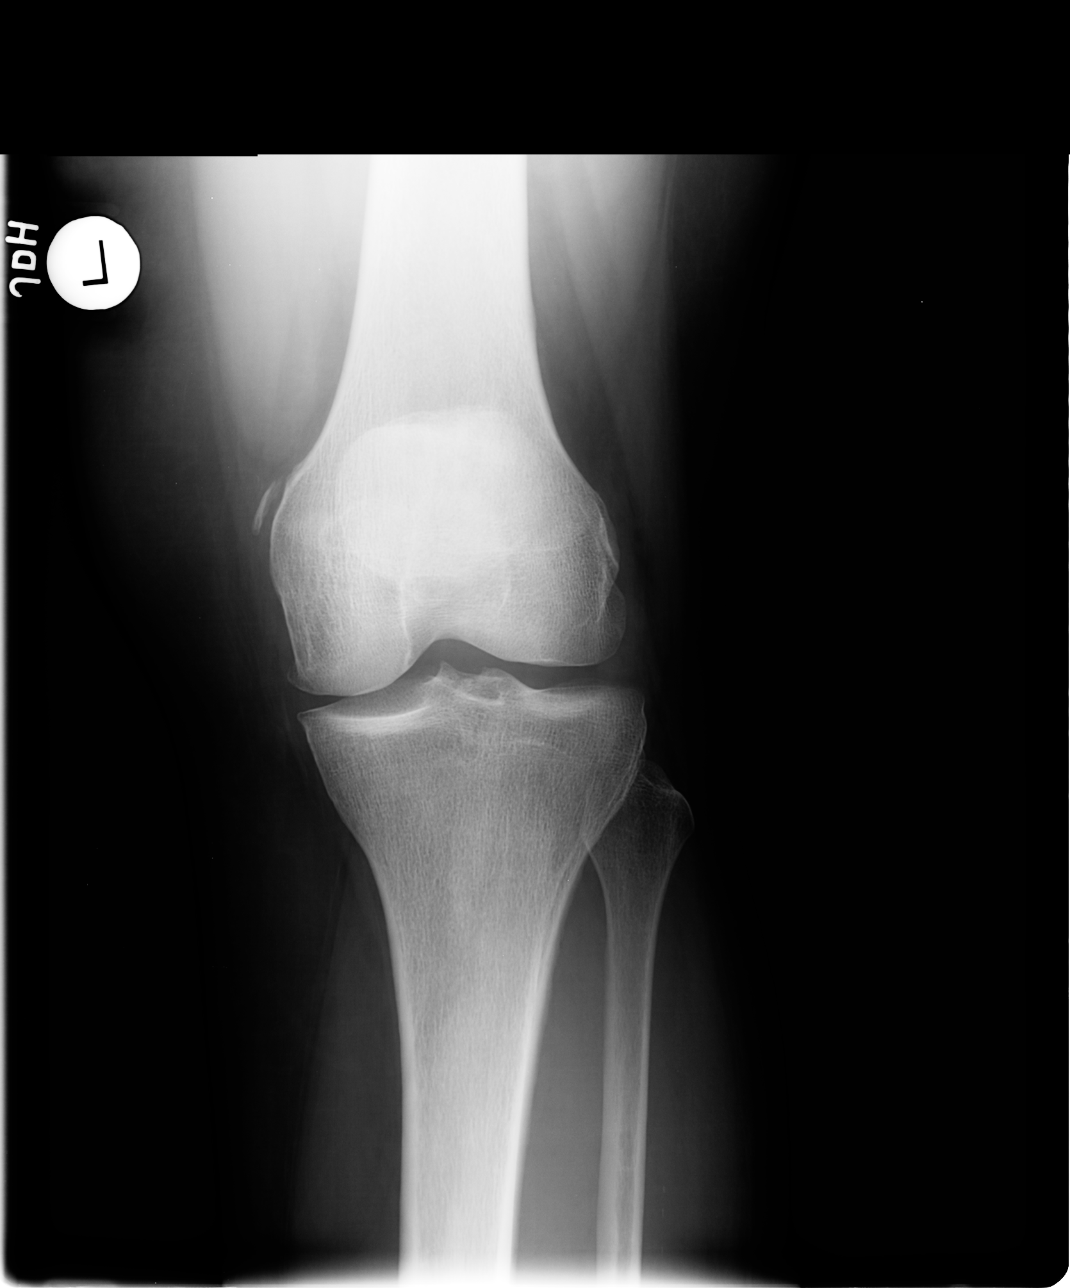

[view not recorded (2 of 4)]
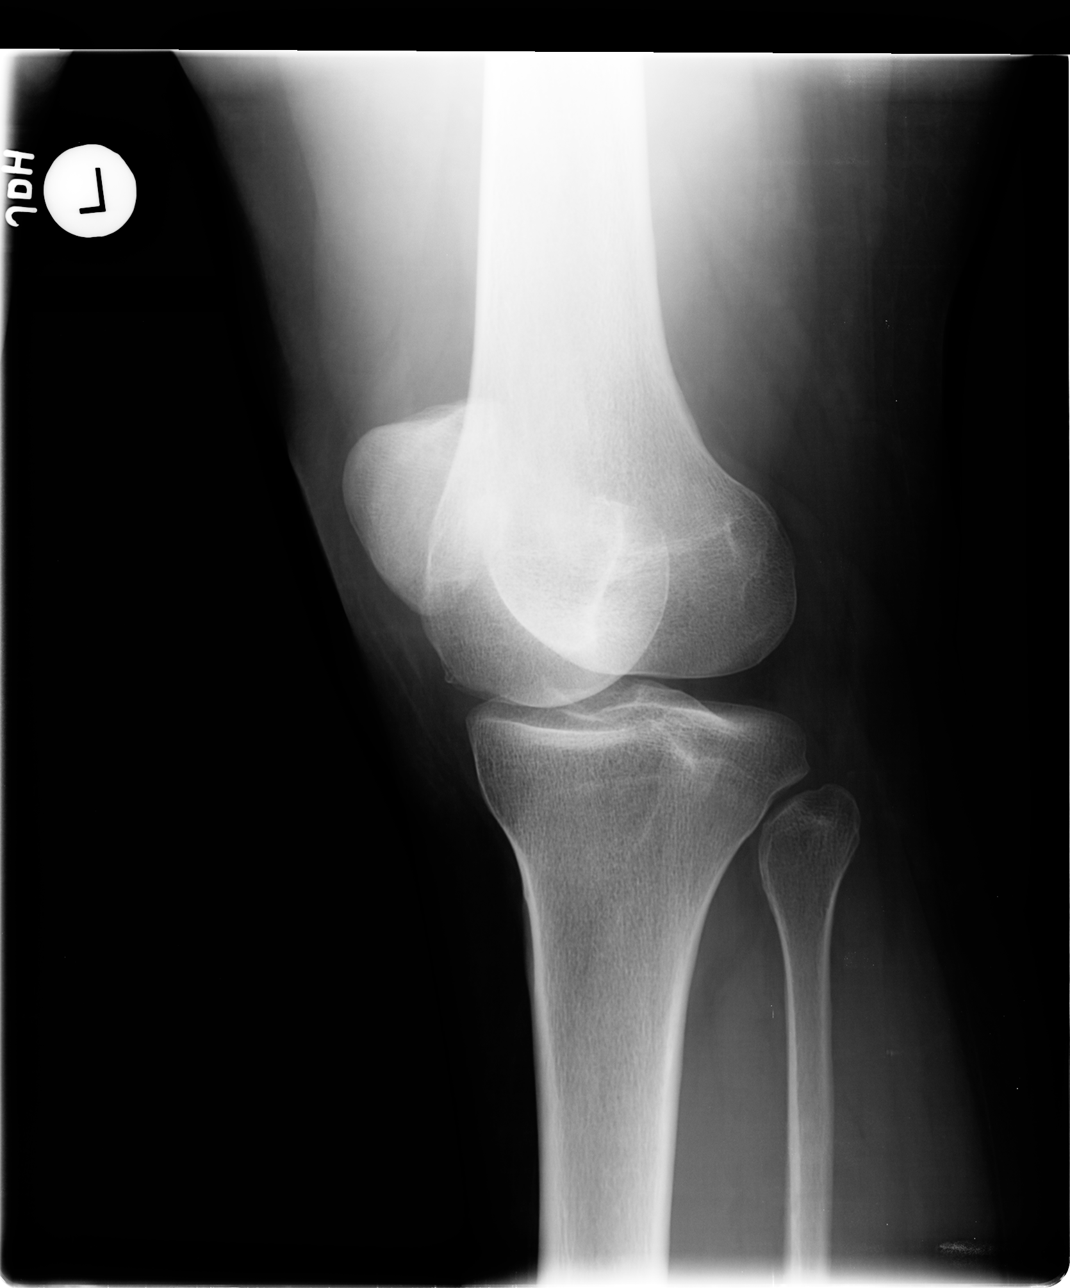

[view not recorded (3 of 4)]
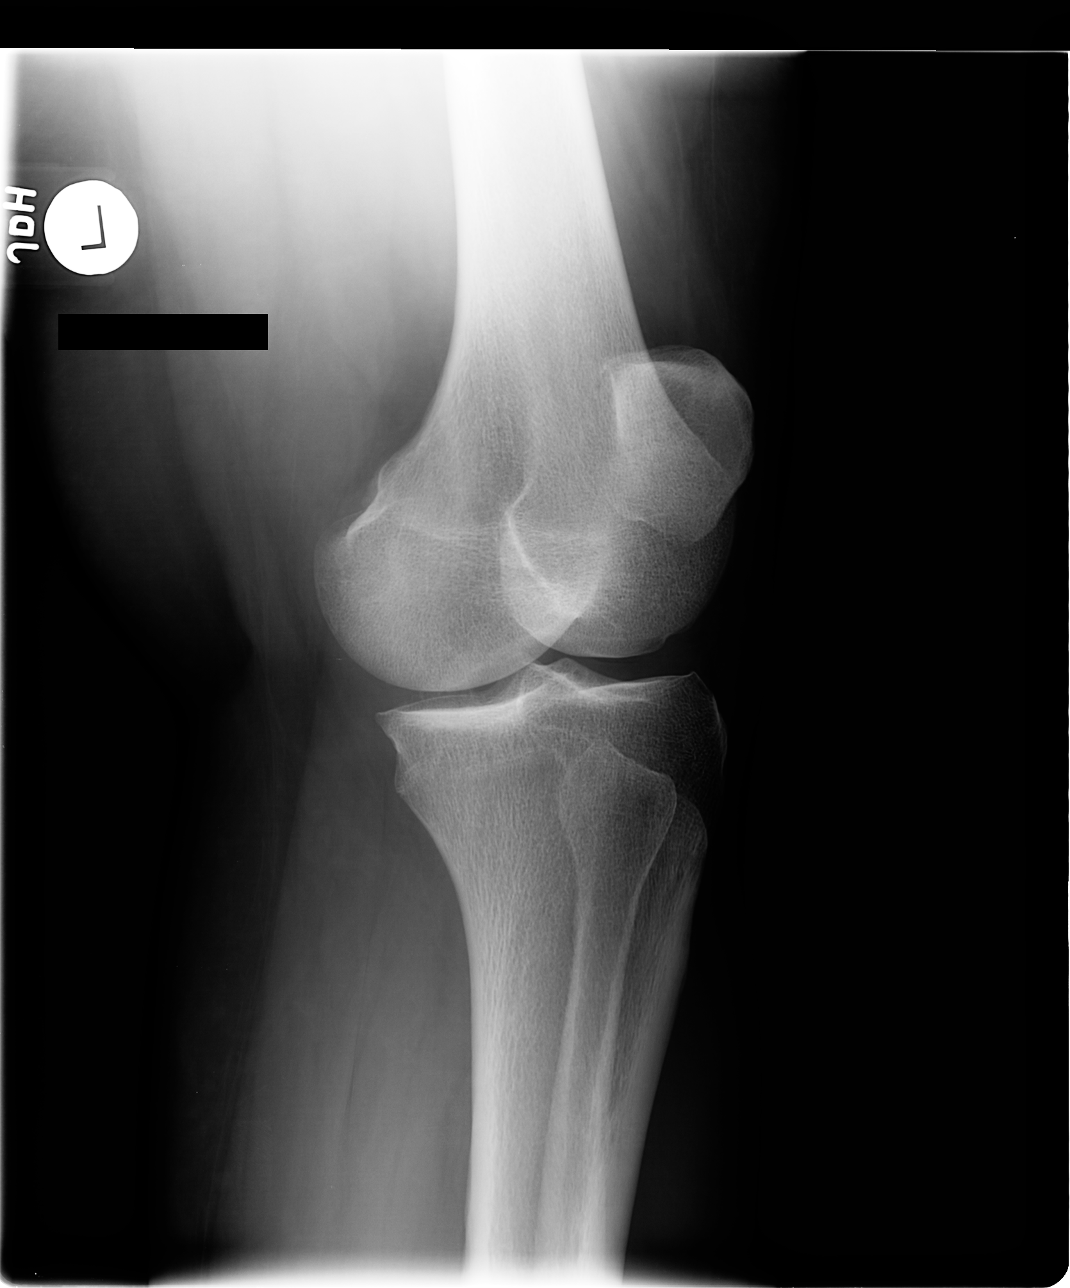

[view not recorded (4 of 4)]
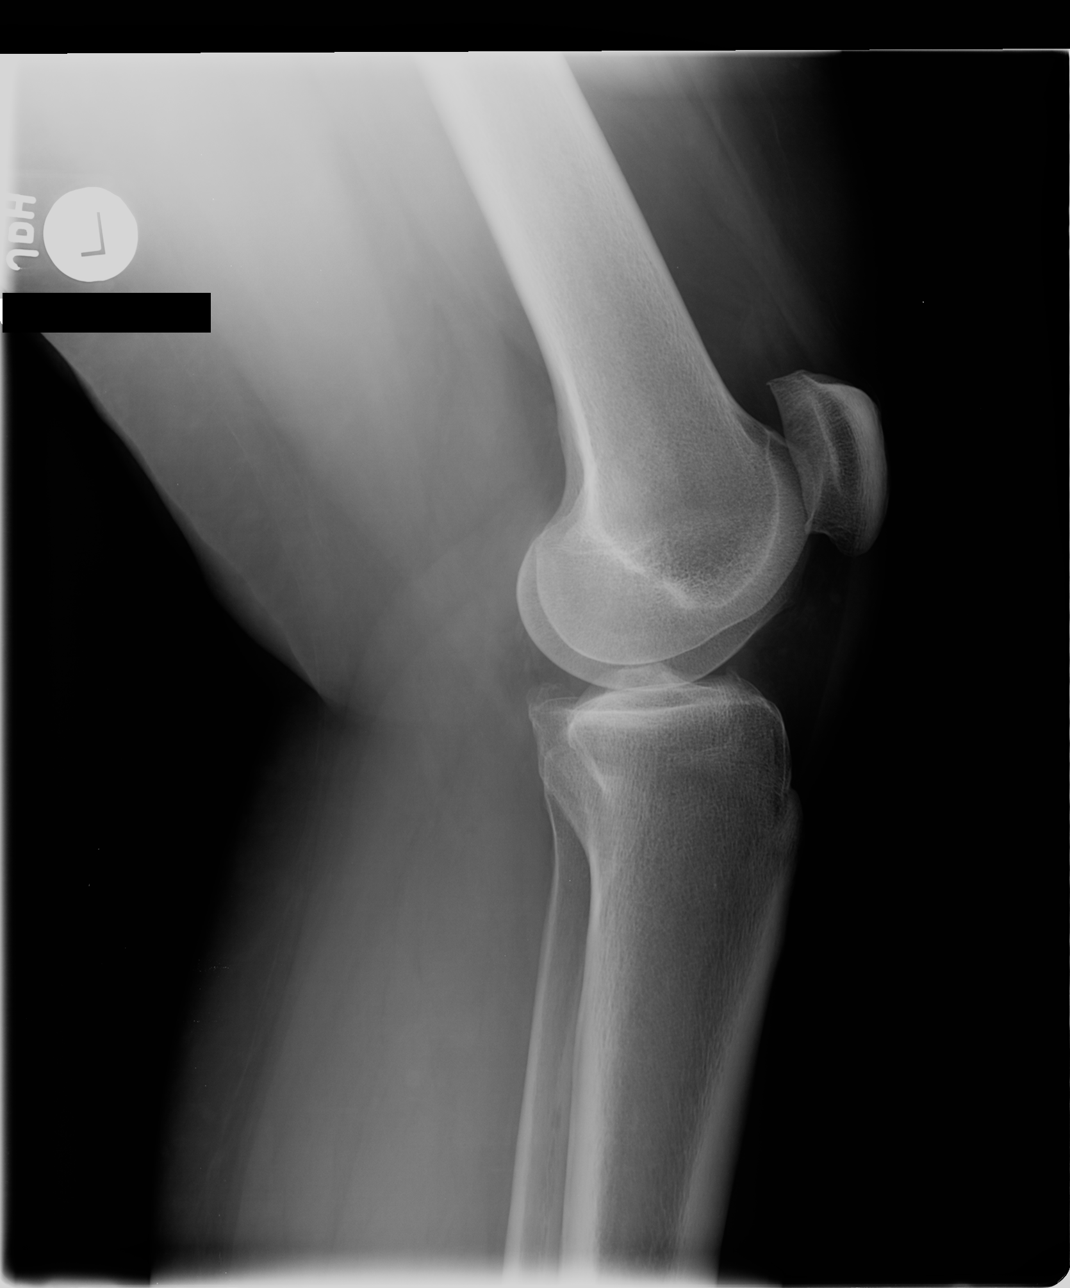

[4 of 4 positions shown; findings below may reference images not displayed]

FINDINGS: No fracture or dislocation. No joint effusion. No lytic or sclerotic
osseous lesion. Mild osteoarthritis of the medial femorotibial
compartment. Ossification adjacent to the proximal medial femoral
condyle as can be seen with the sequela of prior MCL injury.
IMPRESSION: No acute osseous injury of the left knee.

## 2016-03-01 ENCOUNTER — Encounter (HOSPITAL_COMMUNITY): Payer: Self-pay | Admitting: Emergency Medicine

## 2016-03-01 ENCOUNTER — Emergency Department (HOSPITAL_COMMUNITY)
Admission: EM | Admit: 2016-03-01 | Discharge: 2016-03-01 | Disposition: A | Discharge status: Home / Self Care | Payer: 59 | Attending: Emergency Medicine | Admitting: Emergency Medicine

## 2016-03-01 DIAGNOSIS — Z79899 Other long term (current) drug therapy: Secondary | ICD-10-CM | POA: Diagnosis not present

## 2016-03-01 DIAGNOSIS — E1165 Type 2 diabetes mellitus with hyperglycemia: Secondary | ICD-10-CM | POA: Insufficient documentation

## 2016-03-01 DIAGNOSIS — I10 Essential (primary) hypertension: Secondary | ICD-10-CM | POA: Insufficient documentation

## 2016-03-01 DIAGNOSIS — Z87891 Personal history of nicotine dependence: Secondary | ICD-10-CM | POA: Diagnosis not present

## 2016-03-01 DIAGNOSIS — Z7984 Long term (current) use of oral hypoglycemic drugs: Secondary | ICD-10-CM | POA: Diagnosis not present

## 2016-03-01 DIAGNOSIS — R739 Hyperglycemia, unspecified: Secondary | ICD-10-CM

## 2016-03-01 LAB — URINE MICROSCOPIC-ADD ON: Bacteria, UA: NONE SEEN

## 2016-03-01 LAB — URINALYSIS, ROUTINE W REFLEX MICROSCOPIC
Bilirubin Urine: NEGATIVE
Glucose, UA: >1000 mg/dL — AB
Hgb urine dipstick: NEGATIVE
KETONES UR: NEGATIVE mg/dL
LEUKOCYTES UA: NEGATIVE
Nitrite: NEGATIVE
Protein, ur: NEGATIVE mg/dL
SPECIFIC GRAVITY, URINE: 1.013 (ref 1.005–1.030)
pH: 6 (ref 5.0–8.0)

## 2016-03-01 LAB — CBC
HCT: 34.7 % — ABNORMAL LOW (ref 36.0–46.0)
HEMOGLOBIN: 11.8 g/dL — AB (ref 12.0–15.0)
MCH: 22.2 pg — ABNORMAL LOW (ref 26.0–34.0)
MCHC: 34 g/dL (ref 30.0–36.0)
MCV: 65.2 fL — ABNORMAL LOW (ref 78.0–100.0)
Platelets: 327 10*3/uL (ref 150–400)
RBC: 5.32 MIL/uL — ABNORMAL HIGH (ref 3.87–5.11)
RDW: 17.8 % — AB (ref 11.5–15.5)
WBC: 9.1 10*3/uL (ref 4.0–10.5)

## 2016-03-01 LAB — BASIC METABOLIC PANEL
Anion gap: 11 (ref 5–15)
BUN: 23 mg/dL — ABNORMAL HIGH (ref 6–20)
CALCIUM: 10.2 mg/dL (ref 8.9–10.3)
CO2: 23 mmol/L (ref 22–32)
Chloride: 98 mmol/L — ABNORMAL LOW (ref 101–111)
Creatinine, Ser: 1.3 mg/dL — ABNORMAL HIGH (ref 0.44–1.00)
GFR calc Af Amer: 56 mL/min — ABNORMAL LOW (ref >60)
GFR, EST NON AFRICAN AMERICAN: 48 mL/min — AB (ref >60)
GLUCOSE: 390 mg/dL — AB (ref 65–99)
Potassium: 4.8 mmol/L (ref 3.5–5.1)
Sodium: 132 mmol/L — ABNORMAL LOW (ref 135–145)

## 2016-03-01 LAB — CBG MONITORING, ED
GLUCOSE-CAPILLARY: 369 mg/dL — AB (ref 65–99)
Glucose-Capillary: 287 mg/dL — ABNORMAL HIGH (ref 65–99)

## 2016-03-01 MED ORDER — SODIUM CHLORIDE 0.9 % IV BOLUS (SEPSIS)
1000.0000 mL | Freq: Once | INTRAVENOUS | Status: AC
  Administered 2016-03-01: 1000 mL via INTRAVENOUS

## 2016-03-01 NOTE — ED Notes (Signed)
Doctor at bedside.

## 2016-03-01 NOTE — ED Provider Notes (Signed)
[facility]-EMERGENCY DEPT Provider Note   CSN: [id number] Arrival date & time: 03/01/16  1153     History   Chief Complaint Chief Complaint  Patient presents with  . Hyperglycemia    HPI Tracey Cordova is a 45 y.o. female with a hx of DM-II, non-insulin dependent presents to the emergency department noting hyperglycemia that started around 11 AM this morning. Patient states that she ate biscuits and "bo rounds," a sweet frosted pastry, this morning around 9 to 10 AM. She states that she felt mildly light-headed after this while at church and had her glucose checked which was found to be initially 443 and then recheck 454. She then presented to the ED for further evaluation. She states that she has been compliant with her rx'd oral medications, but admits to frequent dietary indiscretions.   HPI  Past Medical History:  Diagnosis Date  . Anemia   . Diabetes mellitus   . High cholesterol   . Hypertension     Patient Active Problem List   Diagnosis Date Noted  . Cellulitis of hand 07/09/2013  . Cellulitis of arm, right 07/09/2013  . Heart murmur on physical examination 07/09/2013  . Cellulitis 01/22/2012  . DM2 (diabetes mellitus, type 2) (HCC) 01/22/2012  . HTN (hypertension) 01/22/2012  . Anemia 01/22/2012    Past Surgical History:  Procedure Laterality Date  . TONSILLECTOMY  2000  . TUBAL LIGATION      OB History    No data available       Home Medications    Prior to Admission medications   Medication Sig Start Date End Date Taking? Authorizing Provider  cloNIDine (CATAPRES) 0.3 MG tablet Take 0.3 mg by mouth daily.    Historical Provider, MD  ferrous sulfate 325 (65 FE) MG tablet Take 325 mg by mouth daily with breakfast.    Historical Provider, MD  hydrochlorothiazide (MICROZIDE) 12.5 MG capsule Take 12.5 mg by mouth daily.    Historical Provider, MD  HYDROcodone-acetaminophen (HYCET) 7.5-325 mg/15 ml solution Take 15 mLs by mouth every 6 (six) hours as needed  for moderate pain or severe pain. 10/26/13   Shon Baton, MD  ibuprofen (ADVIL,MOTRIN) 600 MG tablet Take 1 tablet (600 mg total) by mouth every 6 (six) hours as needed. 10/26/13   Shon Baton, MD  naproxen sodium (ANAPROX) 220 MG tablet Take 220 mg by mouth 2 (two) times daily as needed (for cramps).    Historical Provider, MD  olmesartan (BENICAR) 40 MG tablet Take 40 mg by mouth daily.    Historical Provider, MD  Phenyleph-Doxylamine-DM-APAP (ALKA SELTZER PLUS PO) Take by mouth.    Historical Provider, MD  sitaGLIPtin-metformin (JANUMET) 50-1000 MG per tablet Take 1 tablet by mouth 2 (two) times daily with a meal.    Historical Provider, MD    Family History Family History  Problem Relation Age of Onset  . Other Mother   . Diabetes type II Mother   . Hypertension Mother   . High Cholesterol Mother   . Hypertension Father   . High Cholesterol Father   . GER disease Sister   . Ulcers Son     Social History Social History  Substance Use Topics  . Smoking status: Former Smoker    Quit date: 08/21/2011  . Smokeless tobacco: Former Neurosurgeon  . Alcohol use Yes     Comment: Occassional Use     Allergies   Lisinopril   Review of Systems Review of Systems  Constitutional:  Positive for activity change. Negative for appetite change, chills, fatigue and fever.  HENT: Negative for ear pain, rhinorrhea, sinus pressure, sneezing and tinnitus.   Eyes: Negative for visual disturbance.  Respiratory: Negative for chest tightness and shortness of breath.   Cardiovascular: Negative for chest pain and palpitations.  Gastrointestinal: Negative for abdominal pain, diarrhea, nausea and vomiting.  Genitourinary: Negative for dysuria, frequency and urgency.  Neurological: Positive for light-headedness. Negative for dizziness, syncope, facial asymmetry, speech difficulty, weakness, numbness and headaches.  All other systems reviewed and are negative.    Physical Exam Updated Vital  Signs BP 112/91   Pulse 93   Temp 97.8 F (36.6 C) (Oral)   Resp 16   LMP 02/06/2016   SpO2 100%   Physical Exam  Constitutional: She is oriented to person, place, and time. She appears well-developed and well-nourished. No distress.  HENT:  Head: Normocephalic and atraumatic.  Nose: Nose normal.  Mouth/Throat: Oropharynx is clear and moist.  Eyes: Conjunctivae and EOM are normal. Pupils are equal, round, and reactive to light.  Neck: Neck supple.  Cardiovascular: Normal rate, regular rhythm, normal heart sounds and intact distal pulses.   Pulmonary/Chest: Effort normal and breath sounds normal.  Abdominal: Soft. She exhibits no distension. There is no tenderness.  Musculoskeletal: She exhibits no edema or tenderness.  Neurological: She is alert and oriented to person, place, and time. No cranial nerve deficit. Coordination normal.  Skin: Skin is warm and dry. No rash noted. She is not diaphoretic.  Nursing note and vitals reviewed.    ED Treatments / Results  Labs (all labs ordered are listed, but only abnormal results are displayed) Labs Reviewed  BASIC METABOLIC PANEL - Abnormal; Notable for the following:       Result Value   Sodium 132 (*)    Chloride 98 (*)    Glucose, Bld 390 (*)    BUN 23 (*)    Creatinine, Ser 1.30 (*)    GFR calc non Af Amer 48 (*)    GFR calc Af Amer 56 (*)    All other components within normal limits  CBC - Abnormal; Notable for the following:    RBC 5.32 (*)    Hemoglobin 11.8 (*)    HCT 34.7 (*)    MCV 65.2 (*)    MCH 22.2 (*)    RDW 17.8 (*)    All other components within normal limits  URINALYSIS, ROUTINE W REFLEX MICROSCOPIC (NOT AT [facility]) - Abnormal; Notable for the following:    Glucose, UA >1000 (*)    All other components within normal limits  URINE MICROSCOPIC-ADD ON - Abnormal; Notable for the following:    Squamous Epithelial / LPF 0-5 (*)    All other components within normal limits  CBG MONITORING, ED - Abnormal; Notable  for the following:    Glucose-Capillary 369 (*)    All other components within normal limits  CBG MONITORING, ED - Abnormal; Notable for the following:    Glucose-Capillary 287 (*)    All other components within normal limits    EKG  EKG Interpretation None       Radiology No results found.  Procedures Procedures (including critical care time)  Medications Ordered in ED Medications  sodium chloride 0.9 % bolus 1,000 mL (0 mLs Intravenous Stopped 03/01/16 1501)     Initial Impression / Assessment and Plan / ED Course  I have reviewed the triage vital signs and the nursing notes.  Pertinent labs &  imaging results that were available during my care of the patient were reviewed by me and considered in my medical decision making (see chart for details).  Clinical Course    45 y.o. female with DM-II presents with hyperglycemia and mild light-headedness after eating a pastry this morning. She was given IV fluids and her glucose came down. Labs were drawn and showed normal AG, and bicarb, but did show a mild Cr elevation to 1.3, from her baseline around 0.9 (last measured in 2015). Feel that this was associated with dehydration from polyuria due to hyperglycemia, likely improved after IV fluid administration.   Exam after IV fluids found her to be asymptomatic requesting discharge. She was strongly recommended to make better dietary choices and to follow up with her PCP in the next few days to further discuss her diabetes medication regimen. This plan was discussed with the patient at the bedside and she stated both understanding and agreement with this plan.   Final Clinical Impressions(s) / ED Diagnoses   Final diagnoses:  Hyperglycemia    New Prescriptions Discharge Medication List as of 03/01/2016  2:49 PM       Francoise CeoWarren S Ezekiah Massie, DO 03/02/16 1122    Doug SouSam Jacubowitz, MD 03/02/16 1745

## 2016-03-01 NOTE — ED Provider Notes (Signed)
Patient felt lightheaded 2 hours after eating sweet pastry at 9 AM today. She presently feels back to baseline. She was noted to be hyperglycemic. Patient is awake alert and in no distress   Doug SouSam Dantonio Justen, MD 03/01/16 1337

## 2016-03-01 NOTE — ED Notes (Signed)
CBG 369 

## 2016-03-01 NOTE — ED Notes (Signed)
Pt. Ambulated to restroom with steady gait. No complaints of pain/dizziness.

## 2016-03-01 NOTE — ED Notes (Signed)
Pt ambulated to restroom. Pt tolerated well.  

## 2016-03-01 NOTE — ED Triage Notes (Signed)
Pt. Stated, My Blood sugar is high, at 11 it was 443. At 1130 it was 454. I feel sorted of dizzy.
